# Patient Record
Sex: Female | Born: 1990 | Race: Black or African American | Hispanic: No | Marital: Single | Smoking: Never smoker
Health system: Southern US, Community
[De-identification: ages and names within clinical notes are randomized; demographics above are authoritative.]

## PROBLEM LIST (undated history)

## (undated) DIAGNOSIS — F419 Anxiety disorder, unspecified: Secondary | ICD-10-CM

## (undated) DIAGNOSIS — G35 Multiple sclerosis: Secondary | ICD-10-CM

## (undated) DIAGNOSIS — G35D Multiple sclerosis, unspecified: Secondary | ICD-10-CM

## (undated) DIAGNOSIS — F32A Depression, unspecified: Secondary | ICD-10-CM

## (undated) HISTORY — PX: WISDOM TOOTH EXTRACTION: SHX21

---

## 2018-11-17 ENCOUNTER — Encounter (HOSPITAL_COMMUNITY): Payer: Self-pay | Admitting: Emergency Medicine

## 2018-11-17 ENCOUNTER — Other Ambulatory Visit: Payer: Self-pay

## 2018-11-17 ENCOUNTER — Emergency Department (HOSPITAL_COMMUNITY)
Admission: EM | Admit: 2018-11-17 | Discharge: 2018-11-17 | Disposition: A | Discharge status: Home / Self Care | Payer: BLUE CROSS/BLUE SHIELD | Attending: Emergency Medicine | Admitting: Emergency Medicine

## 2018-11-17 DIAGNOSIS — R1030 Lower abdominal pain, unspecified: Secondary | ICD-10-CM | POA: Insufficient documentation

## 2018-11-17 DIAGNOSIS — Z3A01 Less than 8 weeks gestation of pregnancy: Secondary | ICD-10-CM | POA: Insufficient documentation

## 2018-11-17 DIAGNOSIS — O021 Missed abortion: Secondary | ICD-10-CM | POA: Diagnosis present

## 2018-11-17 LAB — CBC WITH DIFFERENTIAL/PLATELET
Abs Immature Granulocytes: 0.02 10*3/uL (ref 0.00–0.07)
Basophils Absolute: 0 10*3/uL (ref 0.0–0.1)
Basophils Relative: 0 %
Eosinophils Absolute: 0 10*3/uL (ref 0.0–0.5)
Eosinophils Relative: 1 %
HCT: 36.1 % (ref 36.0–46.0)
Hemoglobin: 11.5 g/dL — ABNORMAL LOW (ref 12.0–15.0)
Immature Granulocytes: 0 %
Lymphocytes Relative: 20 %
Lymphs Abs: 1.6 10*3/uL (ref 0.7–4.0)
MCH: 29 pg (ref 26.0–34.0)
MCHC: 31.9 g/dL (ref 30.0–36.0)
MCV: 91.2 fL (ref 80.0–100.0)
Monocytes Absolute: 0.5 10*3/uL (ref 0.1–1.0)
Monocytes Relative: 6 %
Neutro Abs: 5.8 10*3/uL (ref 1.7–7.7)
Neutrophils Relative %: 73 %
Platelets: 231 10*3/uL (ref 150–400)
RBC: 3.96 MIL/uL (ref 3.87–5.11)
RDW: 13.2 % (ref 11.5–15.5)
WBC: 8.1 10*3/uL (ref 4.0–10.5)
nRBC: 0 % (ref 0.0–0.2)

## 2018-11-17 LAB — COMPREHENSIVE METABOLIC PANEL
ALT: 16 U/L (ref 0–44)
AST: 22 U/L (ref 15–41)
Albumin: 4.1 g/dL (ref 3.5–5.0)
Alkaline Phosphatase: 39 U/L (ref 38–126)
Anion gap: 11 (ref 5–15)
BUN: 11 mg/dL (ref 6–20)
CO2: 19 mmol/L — ABNORMAL LOW (ref 22–32)
Calcium: 8.9 mg/dL (ref 8.9–10.3)
Chloride: 107 mmol/L (ref 98–111)
Creatinine, Ser: 0.91 mg/dL (ref 0.44–1.00)
GFR calc Af Amer: >60 mL/min (ref >60)
GFR calc non Af Amer: >60 mL/min (ref >60)
Glucose, Bld: 114 mg/dL — ABNORMAL HIGH (ref 70–99)
Potassium: 3.4 mmol/L — ABNORMAL LOW (ref 3.5–5.1)
Sodium: 137 mmol/L (ref 135–145)
Total Bilirubin: <0.1 mg/dL — ABNORMAL LOW (ref 0.3–1.2)
Total Protein: 7.4 g/dL (ref 6.5–8.1)

## 2018-11-17 LAB — I-STAT BETA HCG BLOOD, ED (MC, WL, AP ONLY): I-stat hCG, quantitative: >2000 m[IU]/mL — ABNORMAL HIGH (ref <5)

## 2018-11-17 LAB — LIPASE, BLOOD: Lipase: 25 U/L (ref 11–51)

## 2018-11-17 MED ORDER — FENTANYL CITRATE (PF) 100 MCG/2ML IJ SOLN
50.0000 ug | Freq: Once | INTRAMUSCULAR | Status: AC
  Administered 2018-11-17: 50 ug via INTRAVENOUS
  Filled 2018-11-17: qty 2

## 2018-11-17 MED ORDER — MISOPROSTOL 200 MCG PO TABS
800.0000 ug | ORAL_TABLET | Freq: Once | ORAL | Status: AC
  Administered 2018-11-17: 08:00:00 800 ug via VAGINAL
  Filled 2018-11-17 (×2): qty 4

## 2018-11-17 MED ORDER — ONDANSETRON HCL 4 MG/2ML IJ SOLN
4.0000 mg | Freq: Once | INTRAMUSCULAR | Status: AC
  Administered 2018-11-17: 4 mg via INTRAVENOUS
  Filled 2018-11-17: qty 2

## 2018-11-17 MED ORDER — FENTANYL CITRATE (PF) 100 MCG/2ML IJ SOLN
100.0000 ug | Freq: Once | INTRAMUSCULAR | Status: AC
  Administered 2018-11-17: 100 ug via INTRAVENOUS
  Filled 2018-11-17: qty 2

## 2018-11-17 MED ORDER — IBUPROFEN 600 MG PO TABS: 600.0000 mg | ORAL_TABLET | Freq: Four times a day (QID) | ORAL | 0 refills | Status: AC | PRN

## 2018-11-17 MED ORDER — MORPHINE SULFATE (PF) 4 MG/ML IV SOLN: 4.0000 mg | Freq: Once | INTRAVENOUS | Status: DC

## 2018-11-17 MED ORDER — KETOROLAC TROMETHAMINE 30 MG/ML IJ SOLN
30.0000 mg | Freq: Once | INTRAMUSCULAR | Status: AC
  Administered 2018-11-17: 06:00:00 30 mg via INTRAVENOUS
  Filled 2018-11-17: qty 1

## 2018-11-17 MED ORDER — HYDROCODONE-ACETAMINOPHEN 5-325 MG PO TABS: 1.0000 | ORAL_TABLET | ORAL | 0 refills | Status: AC | PRN

## 2018-11-17 NOTE — ED Triage Notes (Signed)
Patient is complaining of abdominal pain all over. Patient states she is vomiting blood. In the emesis bag it is a light greenish/yellow color. Patient states she took the plan B at 1pm yesterday. Patient states that she is not bleeding by cramping all over.

## 2018-11-17 NOTE — ED Provider Notes (Addendum)
  Physical Exam  BP 130/72   Pulse 98   Temp 98.3 F (36.8 C) (Oral)   Resp 16   SpO2 98%   Physical Exam  ED Course/Procedures     Procedures  MDM  Patient care received from [facility] at shift change, please see his note for a full HPI.  Briefly, patient was [redacted] weeks pregnant, had a missed abortion OB/GYN Dr. Posey Pronto at [facility] prescribed misoprostol to help with passing.  Patient arrived in the ED with cramping but no bleeding.  Reports pain is quite severe.  Colleague consulted OB/GYN Dr. Marciano Sequin who recommended 800 mcg of Cytotec vaginally and 100 mcg of fentanyl observing for approximately 2 hours if bleeding occurred.  10:14 AM patient reassessed by me, reports she continues to have abdominal cramping.  Inserted Cytotec intravaginally about 3 hours ago has had no bleeding.  At this time will consult OB/GYN on-call for further recommendations.  10:32 AM Spoke to Dr. Roland Rack who stated no need for emergent D/C, patient should follow-up with her OB/GYN.  Will prescribe her 600 mg ibuprofen to treat her pain at home.  Prior to discharge will consult Dr. Posey Pronto.  11:07 AM Spoke to Dr. Posey Pronto, patient's primary OB/GYN he recommended patient follow-up in office on Tuesday.  She will go home with a prescription for ibuprofen 600 mg.  These results were discussed with patient, she will follow-up with Dr. Posey Pronto in office.  Patient began to pass clots prior to discharge.  Encouraged follow-up with her OB/GYN. She was also given a short prescription of NORCO to help with symptoms.  Portions of this note were generated with [employer]. Dictation errors may occur despite best attempts at proofreading.      Janeece Fitting, PA-C 11/17/18 1147    Janeece Fitting, PA-C 11/17/18 1148    Tegeler, Gwenyth Allegra, MD 11/17/18 [phone]

## 2018-11-17 NOTE — Discharge Instructions (Addendum)
Please follow up with Dr. Posey Pronto in office on Tuesday, call to schedule an appointment for Tuesday follow up. If you experience any fever, worsening pain please return to the ED.

## 2018-11-17 NOTE — ED Notes (Signed)
Discharge paperwork reviewed with pt, including follow up care and prescriptions.  Pt requesting wheelchair to ED entrance to meet ride.

## 2018-11-17 NOTE — ED Notes (Signed)
Pt stated she had an "accident" and requested for something to clean herself with. Pt provided with wash cloths, towels and a fresh gown.

## 2018-11-17 NOTE — ED Notes (Signed)
RN called to room, pt reporting that she has now started to vaginally pass clots.  PA, Johana, made aware.   Pt provided clean underpants and sanitary pad.

## 2018-11-17 NOTE — ED Provider Notes (Signed)
Patient seen/examined in the Emergency Department in conjunction with [facility] Provider [facility] Patient reports abdominal cramping after receiving oral Cytotec from her GYN for incomplete abortion Exam : Awake alert, appears uncomfortable, lower abdominal tenderness. Plan: Gynecology recommends giving Cytotec intravaginally, and monitoring patient.  If no bleeding, she would need [facility]    Ripley Fraise, MD 11/17/18 [phone]

## 2018-11-17 NOTE — ED Notes (Signed)
Pt rang out, said she had to vomit. Immediately took her an emesis bag.

## 2018-11-17 NOTE — ED Notes (Signed)
Bed: WTR5 Expected date:  Expected time:  Means of arrival:  Comments: 

## 2018-11-17 NOTE — ED Provider Notes (Signed)
[facility]-EMERGENCY DEPT Provider Note   CSN: [id number] Arrival date & time: 11/17/18  0433     History   Chief Complaint Chief Complaint  Patient presents with  . Abdominal Pain    HPI Carol Berger is a 28 y.o. female.     Patient presents to the emergency department with a chief complaint of abdominal cramps.  She states that yesterday she saw a doctor and was prescribed misoprostil for missed abortion.  She reports having severe abdominal cramps with associated nausea and vomiting.  She states that she has had some streaky blood in her vomit.  She denies any vaginal bleeding.  She has tried taking Tylenol with no relief.  She denies any other associated symptoms.  The history is provided by the patient. No language interpreter was used.    History reviewed. No pertinent past medical history.  There are no active problems to display for this patient.   History reviewed. No pertinent surgical history.   OB History    Gravida  1   Para      Term      Preterm      AB      Living        SAB      TAB      Ectopic      Multiple      Live Births               Home Medications    Prior to Admission medications   Medication Sig Start Date End Date Taking? Authorizing Provider  acetaminophen (TYLENOL) 500 MG tablet Take 1,000 mg by mouth every 6 (six) hours as needed for moderate pain.   Yes [provider]  misoprostol (CYTOTEC) 200 MCG tablet Take 200 mcg by mouth every 8 (eight) hours.  11/14/18  Yes [provider]    Family History History reviewed. No pertinent family history.  Social History Social History   Tobacco Use  . Smoking status: Never Smoker  . Smokeless tobacco: Never Used  Substance Use Topics  . Alcohol use: Not Currently  . Drug use: Not Currently     Allergies   Patient has no known allergies.   Review of Systems Review of Systems  All other systems reviewed and are  negative.    Physical Exam Updated Vital Signs BP 128/77   Pulse (!) 104   Temp 98.3 F (36.8 C) (Oral)   Resp 16   SpO2 94%   Physical Exam Vitals signs and nursing note reviewed.  Constitutional:      General: She is not in acute distress.    Appearance: She is well-developed.  HENT:     Head: Normocephalic and atraumatic.  Eyes:     Conjunctiva/sclera: Conjunctivae normal.  Neck:     Musculoskeletal: Neck supple.  Cardiovascular:     Rate and Rhythm: Normal rate and regular rhythm.     Heart sounds: No murmur.  Pulmonary:     Effort: Pulmonary effort is normal. No respiratory distress.     Breath sounds: Normal breath sounds.  Abdominal:     Palpations: Abdomen is soft.     Tenderness: There is no abdominal tenderness.  Skin:    General: Skin is warm and dry.  Neurological:     Mental Status: She is alert and oriented to person, place, and time.  Psychiatric:        Mood and Affect: Mood normal.  Behavior: Behavior normal.      ED Treatments / Results  Labs (all labs ordered are listed, but only abnormal results are displayed) Labs Reviewed  CBC WITH DIFFERENTIAL/PLATELET  COMPREHENSIVE METABOLIC PANEL  LIPASE, BLOOD  I-STAT BETA HCG BLOOD, ED ([facility], [facility], [facility] ONLY)    EKG None  Radiology No results found.  Procedures Procedures (including critical care time)  Medications Ordered in ED Medications  ketorolac (TORADOL) 30 MG/ML injection 30 mg (has no administration in time range)  ondansetron (ZOFRAN) injection 4 mg (has no administration in time range)     Initial Impression / Assessment and Plan / ED Course  I have reviewed the triage vital signs and the nursing notes.  Pertinent labs & imaging results that were available during my care of the patient were reviewed by me and considered in my medical decision making (see chart for details).        Patient with missed abortion.  Started on Cytotec.  Having cramping, but no bleeding.   Pain is quite severe.  Discussed with Dr. Elonda Husky from OB/GYN, who recommends 800 mcg Cytotec vaginally and 100 mcg fentanyl.  Recommends observing patient for approximately 2 hours to see if she starts to bleed pass tissue.  If not, will need to reconsult OB/GYN for possible D&C.  Patient signed out to [place], [place].  Final Clinical Impressions(s) / ED Diagnoses   Final diagnoses:  None    ED Discharge Orders    None       Montine Circle, PA-C 11/17/18 [id number]    Ripley Fraise, MD 11/17/18 [phone]

## 2019-03-13 ENCOUNTER — Other Ambulatory Visit: Payer: Self-pay

## 2019-03-13 ENCOUNTER — Encounter (HOSPITAL_COMMUNITY): Payer: Self-pay | Admitting: Student

## 2019-03-13 ENCOUNTER — Inpatient Hospital Stay (HOSPITAL_COMMUNITY)
Admission: AD | Admit: 2019-03-13 | Discharge: 2019-03-13 | Disposition: A | Discharge status: Home / Self Care | Payer: BLUE CROSS/BLUE SHIELD | Attending: Obstetrics and Gynecology | Admitting: Obstetrics and Gynecology

## 2019-03-13 DIAGNOSIS — R110 Nausea: Secondary | ICD-10-CM | POA: Diagnosis not present

## 2019-03-13 DIAGNOSIS — R109 Unspecified abdominal pain: Secondary | ICD-10-CM | POA: Diagnosis not present

## 2019-03-13 DIAGNOSIS — N939 Abnormal uterine and vaginal bleeding, unspecified: Secondary | ICD-10-CM | POA: Diagnosis not present

## 2019-03-13 DIAGNOSIS — Z3202 Encounter for pregnancy test, result negative: Secondary | ICD-10-CM

## 2019-03-13 LAB — POCT PREGNANCY, URINE: Preg Test, Ur: NEGATIVE

## 2019-03-13 NOTE — MAU Provider Note (Signed)
First Provider Initiated Contact with Patient 03/13/19 1121      S Ms. Carol Berger is a 28 y.o. G1P0010 non-pregnant female who presents to MAU today with complaint of vaginal bleeding, abdominal cramping, & nausea. Had a miscarriage in June then started on birth control for a month & stopped it. Has had irregular bleeding since then. Goes to ob/gyn in [place]. Had an appointment with them last month but didn't go because the bleeding had stopped. Now has an appointment with them tomorrow for evaluation. Has not taken a pregnancy test at home. Is not saturating pads.    O BP 126/78   Pulse 81   Temp 98.3 F (36.8 C)   Resp 16   Ht 5\' 6"  (1.676 m)   Wt 105.2 kg   LMP 02/15/2019   SpO2 100%   BMI 37.45 kg/m  Physical Exam  Nursing note and vitals reviewed. Constitutional: She appears well-developed and well-nourished. No distress.  HENT:  Head: Normocephalic and atraumatic.  Respiratory: Effort normal. No respiratory distress.  Skin: She is not diaphoretic.  Psychiatric: She has a normal mood and affect. Her behavior is normal. Judgment and thought content normal.    A Non pregnant female Medical screening exam complete 1. Negative pregnancy test     P Discharge from MAU in stable condition Patient has f/u with her gyn tomorrow - encouraged to keep that appointment Instructed to f/u with the ED if her bleeding becomes heavy or abdominal pain becomes severe  Jorje Guild, NP 03/13/2019 11:21 AM

## 2019-03-13 NOTE — MAU Note (Signed)
.   Carol Berger is a 28 y.o. at Unknown here in MAU reporting: that her last normal cycle was the end of September, miscarriage in June and was taking birth control until last month. PT states that she has been having vaginal bleeding on and off since the miscarriage. Lower abdominal pain and vomiting LMP: 02/15/19 Onset of complaint: ongoing Pain score: 9 Vitals:   03/13/19 1113  BP: 126/78  Pulse: 81  Resp: 16  Temp: 98.3 F (36.8 C)  SpO2: 100%     FHT Lab orders placed from triage: UPT

## 2019-08-02 ENCOUNTER — Other Ambulatory Visit: Payer: Self-pay

## 2019-08-02 ENCOUNTER — Emergency Department (HOSPITAL_COMMUNITY): Payer: Managed Care, Other (non HMO)

## 2019-08-02 ENCOUNTER — Inpatient Hospital Stay (HOSPITAL_COMMUNITY)
Admission: EM | Admit: 2019-08-02 | Discharge: 2019-08-03 | DRG: 060 | Disposition: A | Discharge status: Home / Self Care | Payer: Managed Care, Other (non HMO) | Source: Ambulatory Visit | Attending: Internal Medicine | Admitting: Internal Medicine

## 2019-08-02 ENCOUNTER — Encounter (HOSPITAL_COMMUNITY): Payer: Self-pay | Admitting: Emergency Medicine

## 2019-08-02 DIAGNOSIS — Z20822 Contact with and (suspected) exposure to covid-19: Secondary | ICD-10-CM | POA: Diagnosis present

## 2019-08-02 DIAGNOSIS — Z6835 Body mass index (BMI) 35.0-35.9, adult: Secondary | ICD-10-CM | POA: Diagnosis not present

## 2019-08-02 DIAGNOSIS — G35 Multiple sclerosis: Secondary | ICD-10-CM | POA: Diagnosis present

## 2019-08-02 DIAGNOSIS — D709 Neutropenia, unspecified: Secondary | ICD-10-CM | POA: Diagnosis present

## 2019-08-02 DIAGNOSIS — G360 Neuromyelitis optica [Devic]: Principal | ICD-10-CM | POA: Diagnosis present

## 2019-08-02 DIAGNOSIS — E669 Obesity, unspecified: Secondary | ICD-10-CM | POA: Diagnosis present

## 2019-08-02 DIAGNOSIS — Z79899 Other long term (current) drug therapy: Secondary | ICD-10-CM | POA: Diagnosis not present

## 2019-08-02 DIAGNOSIS — H469 Unspecified optic neuritis: Secondary | ICD-10-CM | POA: Diagnosis present

## 2019-08-02 DIAGNOSIS — E66812 Obesity, class 2: Secondary | ICD-10-CM | POA: Diagnosis present

## 2019-08-02 LAB — BASIC METABOLIC PANEL
Anion gap: 9 (ref 5–15)
BUN: 10 mg/dL (ref 6–20)
CO2: 22 mmol/L (ref 22–32)
Calcium: 9 mg/dL (ref 8.9–10.3)
Chloride: 105 mmol/L (ref 98–111)
Creatinine, Ser: 0.82 mg/dL (ref 0.44–1.00)
GFR calc Af Amer: >60 mL/min (ref >60)
GFR calc non Af Amer: >60 mL/min (ref >60)
Glucose, Bld: 90 mg/dL (ref 70–99)
Potassium: 4 mmol/L (ref 3.5–5.1)
Sodium: 136 mmol/L (ref 135–145)

## 2019-08-02 LAB — CBC WITH DIFFERENTIAL/PLATELET
Abs Immature Granulocytes: 0.01 10*3/uL (ref 0.00–0.07)
Basophils Absolute: 0 10*3/uL (ref 0.0–0.1)
Basophils Relative: 1 %
Eosinophils Absolute: 0.1 10*3/uL (ref 0.0–0.5)
Eosinophils Relative: 2 %
HCT: 37.8 % (ref 36.0–46.0)
Hemoglobin: 12 g/dL (ref 12.0–15.0)
Immature Granulocytes: 0 %
Lymphocytes Relative: 50 %
Lymphs Abs: 2.2 10*3/uL (ref 0.7–4.0)
MCH: 29.2 pg (ref 26.0–34.0)
MCHC: 31.7 g/dL (ref 30.0–36.0)
MCV: 92 fL (ref 80.0–100.0)
Monocytes Absolute: 0.4 10*3/uL (ref 0.1–1.0)
Monocytes Relative: 10 %
Neutro Abs: 1.6 10*3/uL — ABNORMAL LOW (ref 1.7–7.7)
Neutrophils Relative %: 37 %
Platelets: 209 10*3/uL (ref 150–400)
RBC: 4.11 MIL/uL (ref 3.87–5.11)
RDW: 14.1 % (ref 11.5–15.5)
WBC: 4.3 10*3/uL (ref 4.0–10.5)
nRBC: 0 % (ref 0.0–0.2)

## 2019-08-02 LAB — CBC
HCT: 36.9 % (ref 36.0–46.0)
Hemoglobin: 11.8 g/dL — ABNORMAL LOW (ref 12.0–15.0)
MCH: 29 pg (ref 26.0–34.0)
MCHC: 32 g/dL (ref 30.0–36.0)
MCV: 90.7 fL (ref 80.0–100.0)
Platelets: 225 10*3/uL (ref 150–400)
RBC: 4.07 MIL/uL (ref 3.87–5.11)
RDW: 14.5 % (ref 11.5–15.5)
WBC: 4.4 10*3/uL (ref 4.0–10.5)
nRBC: 0 % (ref 0.0–0.2)

## 2019-08-02 LAB — I-STAT BETA HCG BLOOD, ED (MC, WL, AP ONLY): I-stat hCG, quantitative: <5 m[IU]/mL (ref <5)

## 2019-08-02 LAB — SEDIMENTATION RATE: Sed Rate: 1 mm/hr (ref 0–22)

## 2019-08-02 LAB — SARS CORONAVIRUS 2 (TAT 6-24 HRS): SARS Coronavirus 2: NEGATIVE

## 2019-08-02 LAB — C-REACTIVE PROTEIN: CRP: 0.5 mg/dL (ref <1.0)

## 2019-08-02 IMAGING — MR MR ORBITS WO/W CM
9 of 16 series · 22 of 48 positions shown · IV contrast (gadavist)
Comparison: None.

CLINICAL DATA: Optic neuritis suspected on the left. Two weeks
duration.

EXAM:
MRI HEAD AND ORBITS WITHOUT AND WITH CONTRAST
TECHNIQUE: Multiplanar, multiecho pulse sequences of the brain and surrounding
structures were obtained without and with intravenous contrast.
Multiplanar, multiecho pulse sequences of the orbits and surrounding
structures were obtained including fat saturation techniques, before
and after intravenous contrast administration.
CONTRAST:  9mL GADAVIST GADOBUTROL 1 MMOL/ML IV SOLN

[Series 4: T2 · axial · 5.0mm · 0.43mm/px · z∈[-80,+66]mm · 2 of 22 slices shown]
[im 1/22]
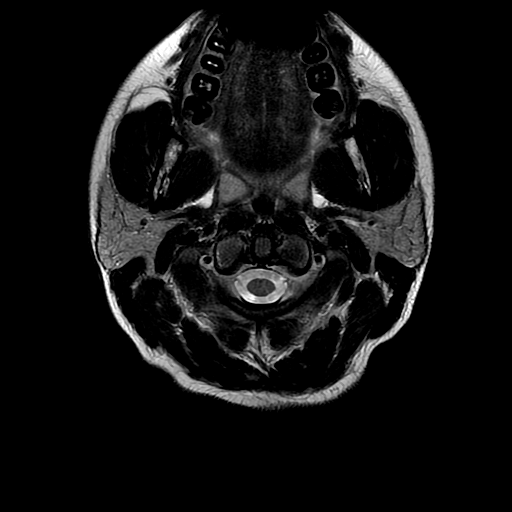
[im 22/22]
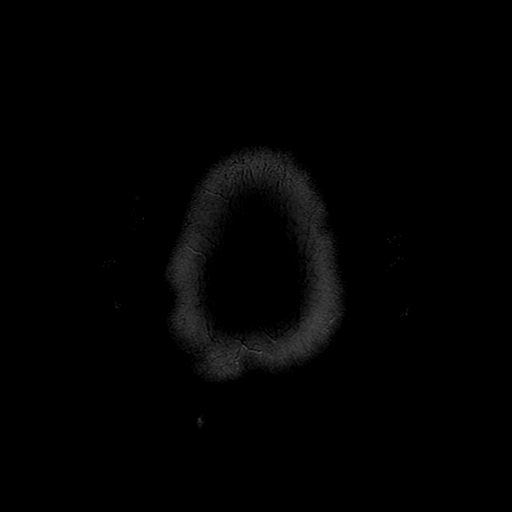

[Series 5: FLAIR · axial · 5.0mm · 0.43mm/px · z∈[-80,+66]mm · 2 of 22 slices shown (1 of 2)]
[im 1/22]
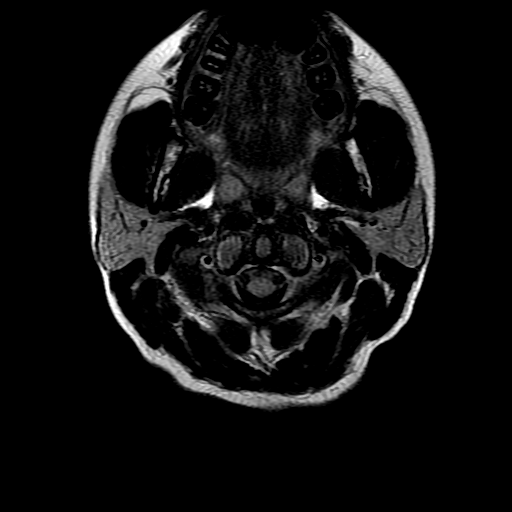
[im 22/22]
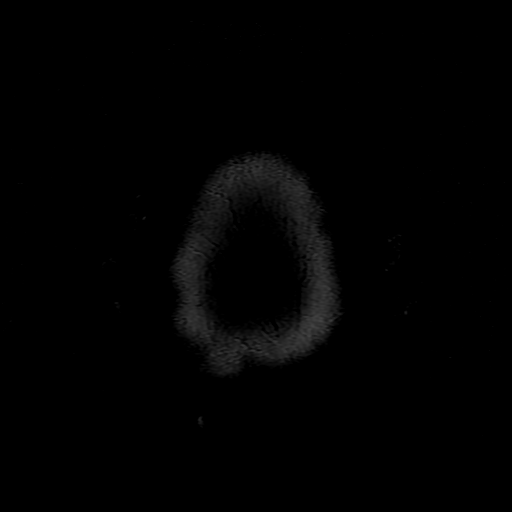

[Series 6: DWI · axial · 5.0mm · 1.09mm/px · z∈[-81,+63]mm · 5 of 60 slices shown]
[im 1/60]
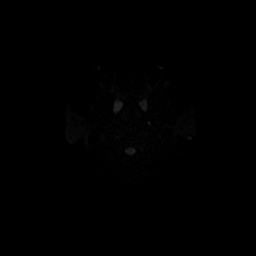
[im 15/60]
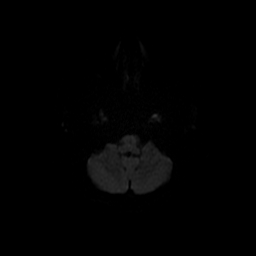
[im 30/60]
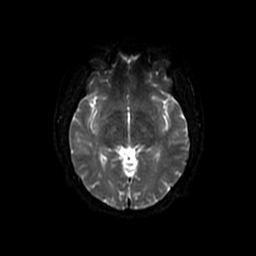
[im 45/60]
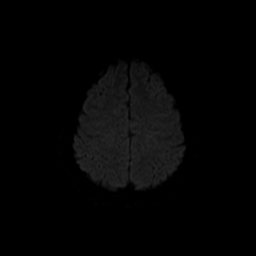
[im 60/60]
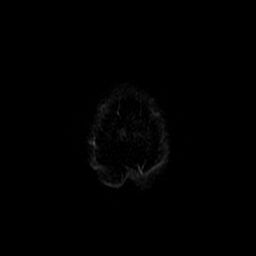

[Series 11: T2 fat-sat · axial · 3.0mm · 0.35mm/px · z∈[-55,+8]mm · 2 of 20 slices shown (1 of 2)]
[im 1/20]
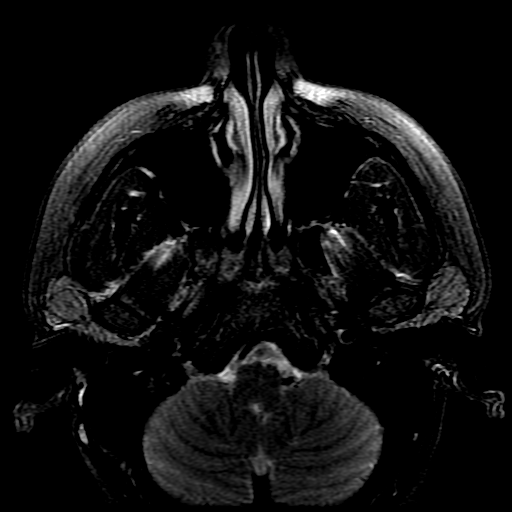
[im 20/20]
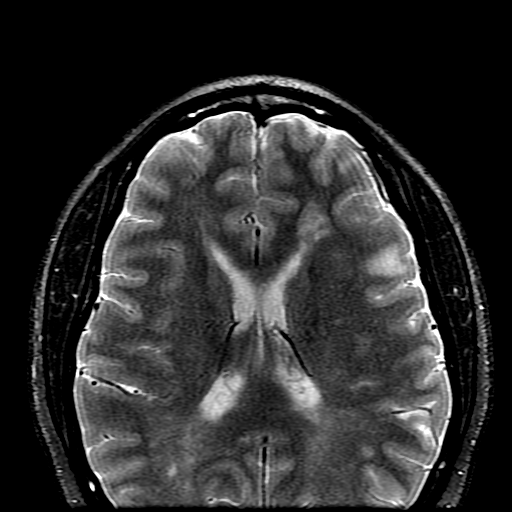

[Series 12: T2 fat-sat · coronal · 3.0mm · 0.35mm/px · 1 of 34 slices shown (2 of 2)]
[im 1/34]
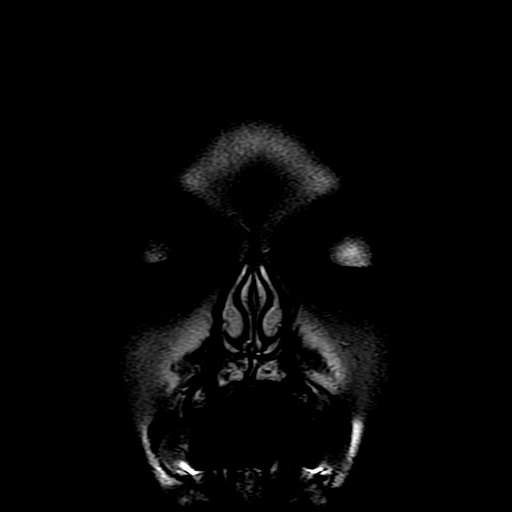

[Series 15: T1 post-contrast · coronal · 5.0mm · 0.39mm/px · 2 of 28 slices shown (1 of 3)]
[im 1/28]
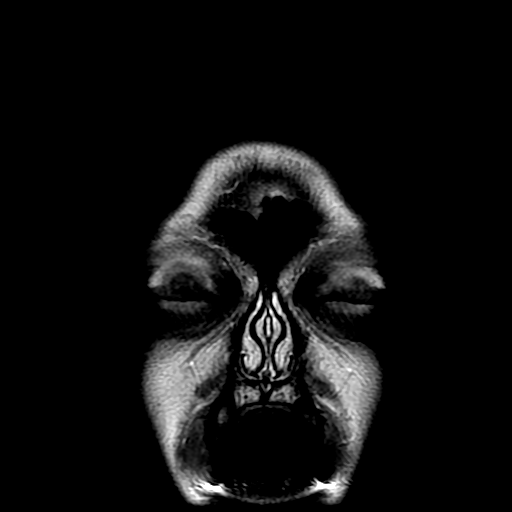
[im 28/28]
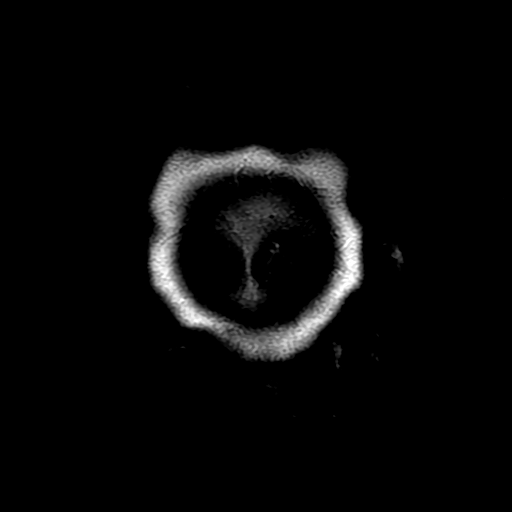

[Series 16: T1 post-contrast · axial · 3.0mm · 0.35mm/px · z∈[-55,+8]mm · 2 of 20 slices shown (2 of 3)]
[im 1/20]
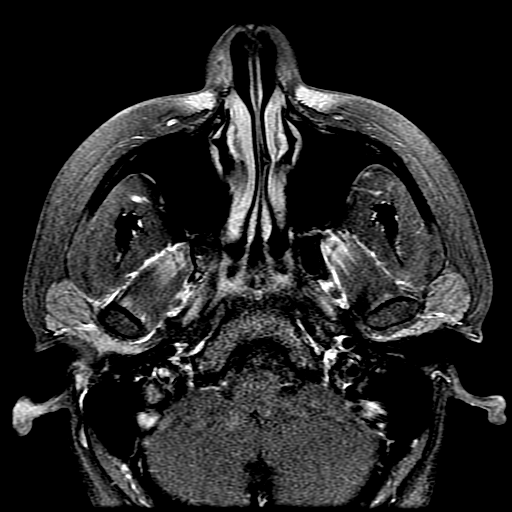
[im 20/20]
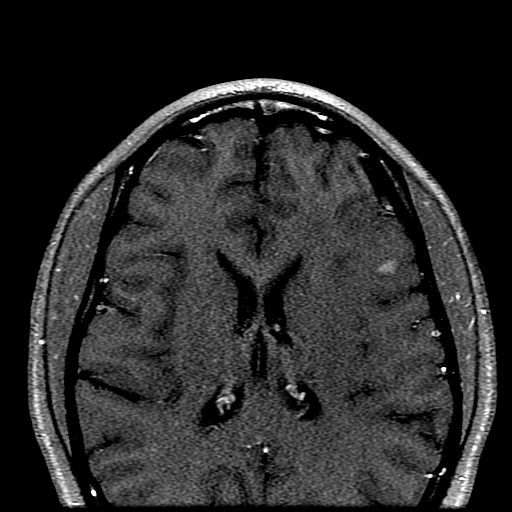

[Series 17: T1 post-contrast · coronal · 3.0mm · 0.35mm/px · 3 of 34 slices shown (3 of 3)]
[im 1/34]
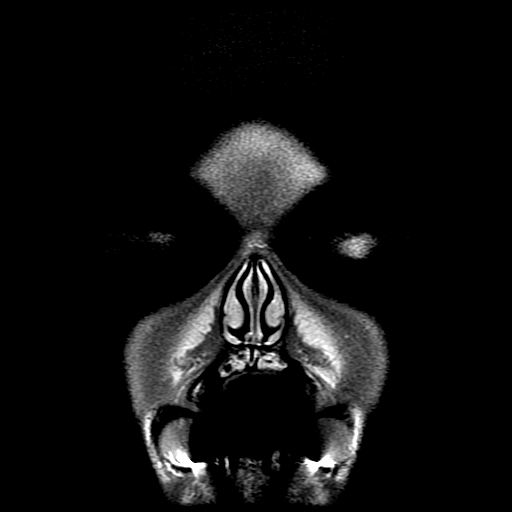
[im 17/34]
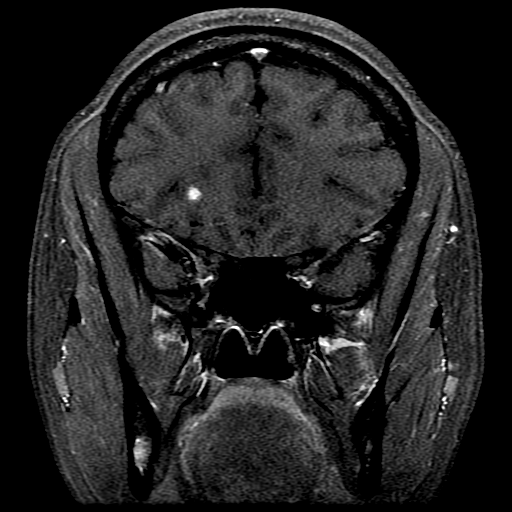
[im 34/34]
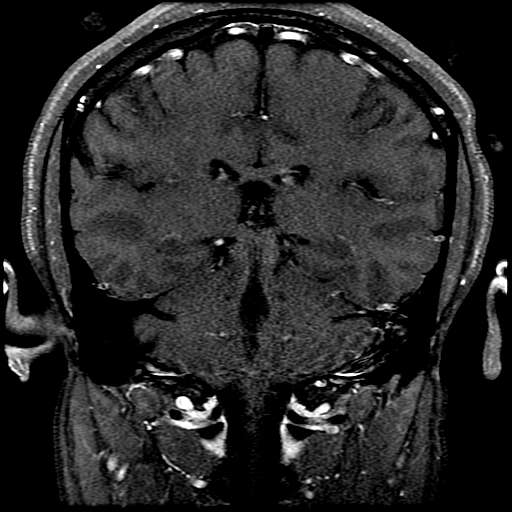

[Series 18: FLAIR · sagittal · 4.0mm · 0.47mm/px · 3 of 30 slices shown (2 of 2)]
[im 1/30]
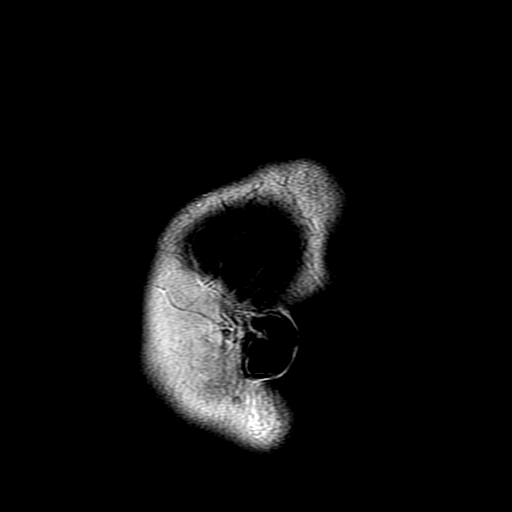
[im 15/30]
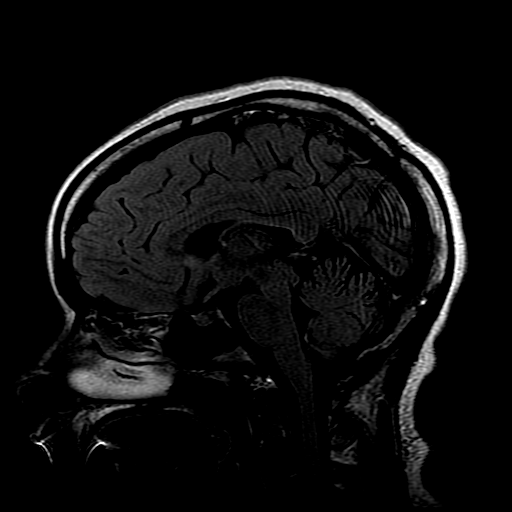
[im 30/30]
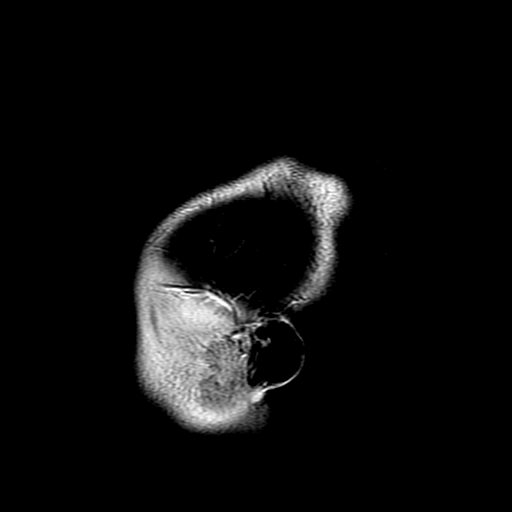

[22 of 48 positions shown; findings below may reference images not displayed]

FINDINGS: MRI HEAD FINDINGS

Brain: No abnormality seen affecting the brainstem or cerebellum.
Cerebral hemispheres show multiple foci of T2 and FLAIR signal
affecting the deep and subcortical white matter with a pattern
typical of multiple sclerosis. There are approximately 15 lesions
within each hemisphere. 5 small white matter lesions in the right
frontal lobe show contrast enhancement. Left frontal subcortical
white matter lesion shows contrast enhancement. No evidence of mass
lesion, ischemic infarction, hemorrhage, hydrocephalus or
extra-axial collection.

Vascular: Major vessels at the base of the brain show flow.

Skull and upper cervical spine: Negative

Other: None

MRI ORBITS FINDINGS

Orbits: Globes are normal. Optic neuritis on the left involving a
segment of the nerve approximately 1.2 cm in length with increased
T2 signal and contrast enhancement, also consistent with multiple
sclerosis involvement. Right optic nerve appears normal.

Visualized sinuses: Paranasal sinuses are clear

Soft tissues: No soft tissue lesion in the area.

Limited intracranial: See results of brain MRI.
IMPRESSION: Acute demyelinating disease/multiple sclerosis presentation.
Multiple T2 and FLAIR bright lesions scattered throughout the
cerebral hemispheric white matter with a pattern and distribution
typical of multiple sclerosis. Approximately 15 lesions in each
hemisphere. Scattered lesions show contrast enhancement, further
evidence of active nature.

Acute optic neuritis on the left affecting a 12 mm segment of the
nerve, pre chiasmatic.

## 2019-08-02 IMAGING — DX DG CHEST 1V PORT
1 series · 1 of 1 positions shown · non-contrast
Comparison: None.

CLINICAL DATA: Blurry vision MS

EXAM:
PORTABLE CHEST 1 VIEW

[chest ap]
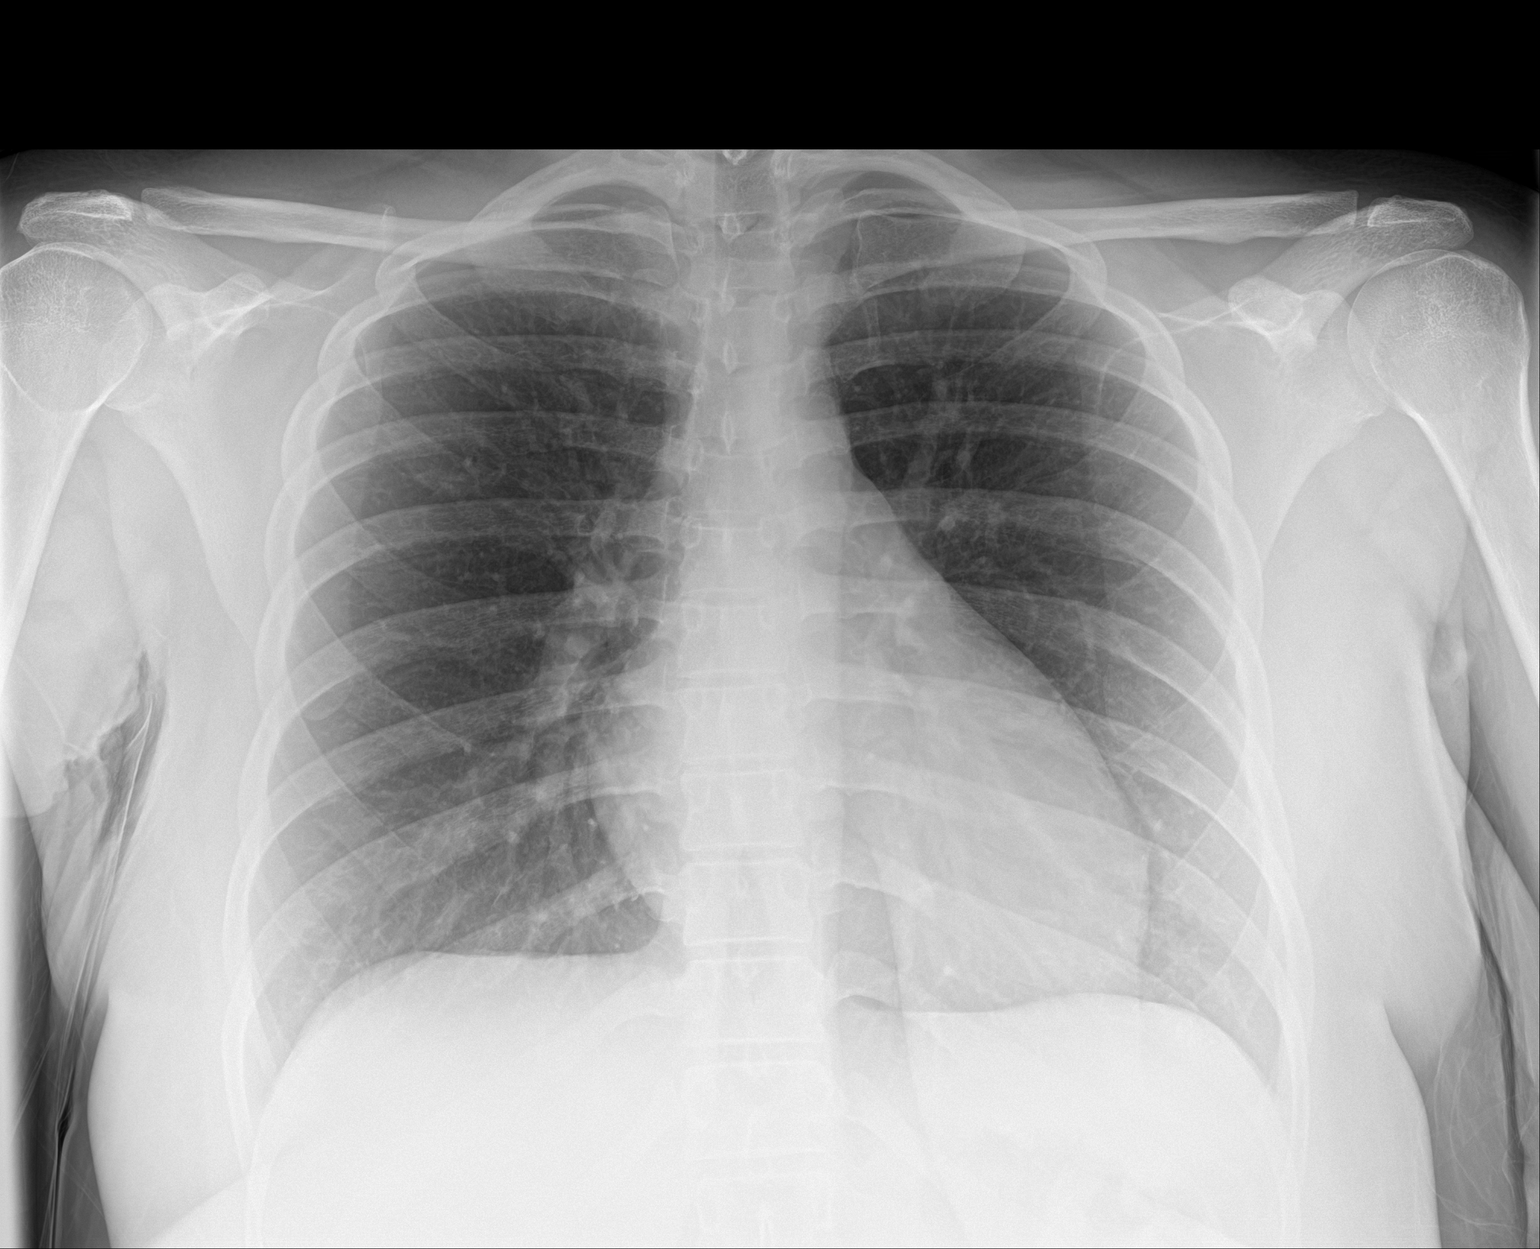

[1 of 1 positions shown; findings below may reference images not displayed]

FINDINGS: The heart size and mediastinal contours are within normal limits.
Both lungs are clear. The visualized skeletal structures are
unremarkable.
IMPRESSION: No active disease.

## 2019-08-02 MED ORDER — SODIUM CHLORIDE 0.9 % IV SOLN
1000.0000 mg | Freq: Once | INTRAVENOUS | Status: AC
  Administered 2019-08-02: 1000 mg via INTRAVENOUS
  Filled 2019-08-02: qty 8

## 2019-08-02 MED ORDER — HYDROCODONE-ACETAMINOPHEN 5-325 MG PO TABS: 1.0000 | ORAL_TABLET | ORAL | Status: DC | PRN

## 2019-08-02 MED ORDER — ONDANSETRON HCL 4 MG PO TABS: 4.0000 mg | ORAL_TABLET | Freq: Four times a day (QID) | ORAL | Status: DC | PRN

## 2019-08-02 MED ORDER — ENOXAPARIN SODIUM 40 MG/0.4ML ~~LOC~~ SOLN
40.0000 mg | SUBCUTANEOUS | Status: DC
  Administered 2019-08-02: 40 mg via SUBCUTANEOUS
  Filled 2019-08-02: qty 0.4

## 2019-08-02 MED ORDER — ACETAMINOPHEN 325 MG PO TABS
650.0000 mg | ORAL_TABLET | Freq: Four times a day (QID) | ORAL | Status: DC | PRN
  Filled 2019-08-02: qty 2

## 2019-08-02 MED ORDER — SODIUM CHLORIDE 0.9 % IV SOLN: 1000.0000 mg | Freq: Every day | INTRAVENOUS | Status: DC

## 2019-08-02 MED ORDER — SODIUM CHLORIDE 0.9 % IV SOLN
1000.0000 mg | Freq: Every day | INTRAVENOUS | Status: DC
  Administered 2019-08-03: 1000 mg via INTRAVENOUS
  Filled 2019-08-02: qty 8

## 2019-08-02 MED ORDER — SENNOSIDES-DOCUSATE SODIUM 8.6-50 MG PO TABS: 1.0000 | ORAL_TABLET | Freq: Every evening | ORAL | Status: DC | PRN

## 2019-08-02 MED ORDER — GADOBUTROL 1 MMOL/ML IV SOLN
9.0000 mL | Freq: Once | INTRAVENOUS | Status: AC | PRN
  Administered 2019-08-02: 9 mL via INTRAVENOUS

## 2019-08-02 MED ORDER — ZOLPIDEM TARTRATE 5 MG PO TABS: 5.0000 mg | ORAL_TABLET | Freq: Every evening | ORAL | Status: DC | PRN

## 2019-08-02 MED ORDER — ONDANSETRON HCL 4 MG/2ML IJ SOLN: 4.0000 mg | Freq: Four times a day (QID) | INTRAMUSCULAR | Status: DC | PRN

## 2019-08-02 MED ORDER — ACETAMINOPHEN 650 MG RE SUPP: 650.0000 mg | Freq: Four times a day (QID) | RECTAL | Status: DC | PRN

## 2019-08-02 NOTE — ED Notes (Signed)
Rec'd pt from ED nurse, solumedrol infusion had not been started. Infusion in tube station and started.

## 2019-08-02 NOTE — H&P (Signed)
History and Physical    Yarissa Reining QIW:[id number] DOB: 12/29/1990 DOA: 08/02/2019  PCP: Patient, No Pcp Per   Patient coming from:   Chief Complaint: Ophthalmology office  HPI: Jaeda Bruso is a 29 y.o. female with no past medical history except for morbid obesity BMI 35 sent to the ER with left eye pain and vision loss.  She reports symptoms started 2 weeks ago on left eye with pain during movement, left-sided headache and blurry vision under peripheral vision.  She was seen by ophthalmologist Dr. Sherryll Burger who recommended/sent her to the ED for evaluation for MS/optic neuritis.  Patient has been complaining of intermittent tingling in her fingers(thumb and index finger bilaterally) for years and there is currently tingling in her toes bilaterally for last few months. Patient c/o blurry vision on left eye, able to see. She denies any fever nausea vomiting chest pain shortness of breath or focal weakness. Patient unable to confirm if any family history of multiple sclerosis.  ED Course: Patient was nonfocal on exam, vitals are stable, routine CBC BMP pregnancy test are unremarkable.  COVID-19 pending.  MRI report as follows significant for acute optic neuritis on the left, acute demyelinating disease/multiple sclerosis on cerebral hemisphere  "Acute demyelinating disease/multiple sclerosis presentation. Multiple T2 and FLAIR bright lesions scattered throughout the cerebral hemispheric white matter with a pattern and distribution typical of multiple sclerosis. Approximately 15 lesions in each hemisphere. Scattered lesions show contrast enhancement, further evidence of active nature. Acute optic neuritis on the left affecting a 12 mm segment of the nerve, pre chiasmatic"   Neurology was consulted and started on Solu-Medrol 1000 mg and admission was requested under hospitalist service.  Review of Systems: All systems were reviewed and were negative except as mentioned in HPI above. Negative for  fever Negative for chest pain Negative for shortness of breath  PMH No significant past medical history Past Surgical History:  Procedure Laterality Date  . WISDOM TOOTH EXTRACTION       reports that she has never smoked. She has never used smokeless tobacco. She reports previous alcohol use. She reports previous drug use.  No Known Allergies  No family history on file.   Prior to Admission medications   Medication Sig Start Date End Date Taking? Authorizing Provider  acetaminophen (TYLENOL) 500 MG tablet Take 500-1,000 mg by mouth every 6 (six) hours as needed for mild pain, moderate pain or headache.    Yes [provider]  norgestrel-ethinyl estradiol (CRYSELLE-28) 0.3-30 MG-MCG tablet Take 1 tablet by mouth daily.   Yes [provider]    Physical Exam: Vitals:   08/02/19 1137  BP: 133/73  Pulse: 66  Resp: 15  Temp: 98.1 F (36.7 C)  TempSrc: Oral  SpO2: 98%  Weight: 90.7 kg  Height: 5\' 3"  (1.6 m)   General exam: AAOx3,  NAD HEENT:Oral mucosa moist, Ear/Nose WNL grossly, dentition normal. Respiratory system: bilaterally clear,no wheezing or crackles,no use of accessory muscle Cardiovascular system: S1 & S2 +, No JVD,. Gastrointestinal system: Abdomen soft, NT,ND, BS+ Nervous System:Alert, awake, moving extremities and grossly nonfocal.  Decreased vision on the left eye with blurriness, able to see. Extremities: No edema, distal peripheral pulses palpable.  Skin: No rashes,no icterus. MSK: Normal muscle bulk,tone, power  Labs on Admission: I have personally reviewed following labs and imaging studies  CBC: Recent Labs  Lab 08/02/19 1237  WBC 4.3  NEUTROABS 1.6*  HGB 12.0  HCT 37.8  MCV 92.0  PLT 209  Basic Metabolic Panel: Recent Labs  Lab 08/02/19 1237  NA 136  K 4.0  CL 105  CO2 22  GLUCOSE 90  BUN 10  CREATININE 0.82  CALCIUM 9.0   GFR: Estimated Creatinine Clearance: 111.1 mL/min (by C-G formula based on SCr of 0.82  mg/dL). Liver Function Tests: No results for input(s): AST, ALT, ALKPHOS, BILITOT, PROT, ALBUMIN in the last 168 hours. No results for input(s): LIPASE, AMYLASE in the last 168 hours. No results for input(s): AMMONIA in the last 168 hours. Coagulation Profile: No results for input(s): INR, PROTIME in the last 168 hours. Cardiac Enzymes: No results for input(s): CKTOTAL, CKMB, CKMBINDEX, TROPONINI in the last 168 hours. BNP (last 3 results) No results for input(s): PROBNP in the last 8760 hours. HbA1C: No results for input(s): HGBA1C in the last 72 hours. CBG: No results for input(s): GLUCAP in the last 168 hours. Lipid Profile: No results for input(s): CHOL, HDL, LDLCALC, TRIG, CHOLHDL, LDLDIRECT in the last 72 hours. Thyroid Function Tests: No results for input(s): TSH, T4TOTAL, FREET4, T3FREE, THYROIDAB in the last 72 hours. Anemia Panel: No results for input(s): VITAMINB12, FOLATE, FERRITIN, TIBC, IRON, RETICCTPCT in the last 72 hours. Urine analysis: No results found for: COLORURINE, APPEARANCEUR, LABSPEC, PHURINE, GLUCOSEU, HGBUR, BILIRUBINUR, KETONESUR, PROTEINUR, UROBILINOGEN, NITRITE, LEUKOCYTESUR  Radiological Exams on Admission: MR Brain W and Wo Contrast  Result Date: 08/02/2019 CLINICAL DATA:  Optic neuritis suspected on the left. Two weeks duration. EXAM: MRI HEAD AND ORBITS WITHOUT AND WITH CONTRAST TECHNIQUE: Multiplanar, multiecho pulse sequences of the brain and surrounding structures were obtained without and with intravenous contrast. Multiplanar, multiecho pulse sequences of the orbits and surrounding structures were obtained including fat saturation techniques, before and after intravenous contrast administration. CONTRAST:  79mL GADAVIST GADOBUTROL 1 MMOL/ML IV SOLN COMPARISON:  None. FINDINGS: MRI HEAD FINDINGS Brain: No abnormality seen affecting the brainstem or cerebellum. Cerebral hemispheres show multiple foci of T2 and FLAIR signal affecting the deep and  subcortical white matter with a pattern typical of multiple sclerosis. There are approximately 15 lesions within each hemisphere. 5 small white matter lesions in the right frontal lobe show contrast enhancement. Left frontal subcortical white matter lesion shows contrast enhancement. No evidence of mass lesion, ischemic infarction, hemorrhage, hydrocephalus or extra-axial collection. Vascular: Major vessels at the base of the brain show flow. Skull and upper cervical spine: Negative Other: None MRI ORBITS FINDINGS Orbits: Globes are normal. Optic neuritis on the left involving a segment of the nerve approximately 1.2 cm in length with increased T2 signal and contrast enhancement, also consistent with multiple sclerosis involvement. Right optic nerve appears normal. Visualized sinuses: Paranasal sinuses are clear Soft tissues: No soft tissue lesion in the area. Limited intracranial: See results of brain MRI. IMPRESSION: Acute demyelinating disease/multiple sclerosis presentation. Multiple T2 and FLAIR bright lesions scattered throughout the cerebral hemispheric white matter with a pattern and distribution typical of multiple sclerosis. Approximately 15 lesions in each hemisphere. Scattered lesions show contrast enhancement, further evidence of active nature. Acute optic neuritis on the left affecting a 12 mm segment of the nerve, pre chiasmatic. Electronically Signed   By: Nelson Chimes M.D.   On: 08/02/2019 14:36   DG Chest Portable 1 View  Result Date: 08/02/2019 CLINICAL DATA:  Blurry vision MS EXAM: PORTABLE CHEST 1 VIEW COMPARISON:  None. FINDINGS: The heart size and mediastinal contours are within normal limits. Both lungs are clear. The visualized skeletal structures are unremarkable. IMPRESSION: No active disease. Electronically Signed  By: Jasmine Pang M.D.   On: 08/02/2019 15:16   MR ORBITS W WO CONTRAST  Result Date: 08/02/2019 CLINICAL DATA:  Optic neuritis suspected on the left. Two weeks  duration. EXAM: MRI HEAD AND ORBITS WITHOUT AND WITH CONTRAST TECHNIQUE: Multiplanar, multiecho pulse sequences of the brain and surrounding structures were obtained without and with intravenous contrast. Multiplanar, multiecho pulse sequences of the orbits and surrounding structures were obtained including fat saturation techniques, before and after intravenous contrast administration. CONTRAST:  84mL GADAVIST GADOBUTROL 1 MMOL/ML IV SOLN COMPARISON:  None. FINDINGS: MRI HEAD FINDINGS Brain: No abnormality seen affecting the brainstem or cerebellum. Cerebral hemispheres show multiple foci of T2 and FLAIR signal affecting the deep and subcortical white matter with a pattern typical of multiple sclerosis. There are approximately 15 lesions within each hemisphere. 5 small white matter lesions in the right frontal lobe show contrast enhancement. Left frontal subcortical white matter lesion shows contrast enhancement. No evidence of mass lesion, ischemic infarction, hemorrhage, hydrocephalus or extra-axial collection. Vascular: Major vessels at the base of the brain show flow. Skull and upper cervical spine: Negative Other: None MRI ORBITS FINDINGS Orbits: Globes are normal. Optic neuritis on the left involving a segment of the nerve approximately 1.2 cm in length with increased T2 signal and contrast enhancement, also consistent with multiple sclerosis involvement. Right optic nerve appears normal. Visualized sinuses: Paranasal sinuses are clear Soft tissues: No soft tissue lesion in the area. Limited intracranial: See results of brain MRI. IMPRESSION: Acute demyelinating disease/multiple sclerosis presentation. Multiple T2 and FLAIR bright lesions scattered throughout the cerebral hemispheric white matter with a pattern and distribution typical of multiple sclerosis. Approximately 15 lesions in each hemisphere. Scattered lesions show contrast enhancement, further evidence of active nature. Acute optic neuritis on the  left affecting a 12 mm segment of the nerve, pre chiasmatic. Electronically Signed   By: Paulina Fusi M.D.   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 Appreciate neurology input starting on high-dose Solu-Medrol 1 g x 5 days. We will continue plan as per neurology.  COVID-19 pending. UA pending, chest x-ray-NAD.neuromyelitis optica antibodies noted plan for MRI C-spine T-spine with and without contrast tomorrow.  She will need follow-up with outpatient [facility] for disease modifying therapy discussion and initiation.  PT OT and speech therapy.  Morbid obesity with BMI 35.4: She will benefit with weight loss and healthy lifestyle and counseling prior to discharge.  Neutropenia ANC 1600.  Monitor.  Severity of Illness: I certify that at the point of admission it is my clinical judgment that the patient will require inpatient hospital care spanning beyond 2 midnights from the point of admission due to high intensity of service, high risk for further deterioration and high frequency of surveillance required given her acute MS flareup and optic neuritis.   DVT prophylaxis: Lovenox Code Status: Full code Family Communication: Admission, patients condition and plan of care including tests being ordered have been discussed with the patient who indicate understanding and agree with the plan and Code Status.  Consults called:   Lanae Boast MD [facility] Pager [phone]  If 7PM-7AM, please contact night-coverage [email or web address]  08/02/2019, 3:53 PM

## 2019-08-02 NOTE — Consult Note (Signed)
Neurology Consultation  Reason for Consult: Left-sided painful visual loss Referring Physician: Dr. Rubin Payor, ED provider/Meena Luevenia Maxin PA-C-ED provider  CC: Blurred vision in the left eye, tingling and numbness in fingers  History is obtained from: Patient, chart  HPI: Carol Berger is a 29 y.o. female who has no past medical history, presented to the emergency room upon the recommendation of an ophthalmologist, who she had seen for left-sided visual loss. She reports that for the past couple of weeks, she has been having difficulty seeing out of her left eye and has had pain when moving that eye.  She went to see her ophthalmologist, who was concern for retrobulbar neuritis based on the exam and sent her for evaluation with further imaging of the brain and orbits. MRI brain and orbits was done which revealed multiple lesions consistent with demyelinating process in both cerebral hemispheres as well as an enhancing left optic nerve consistent with left optic neuritis. Upon further history taking, she reports that in 2016 she had an episode of numbness in her hands and was seen by her physician and told that she has carpal tunnel syndrome.  She has had episodes of hand and feet numbness ever since then. Denies any family history of MS but said that she will confirm with her parents and other family members. Does not know of anyone in the family with a history of lupus or any other autoimmune disease. Also has a history of frequent headaches and migraines since late teens. No history of pain in the neck or tingling/shocklike sensation on bending the neck.  ROS: Performed and negative except noted in HPI  History reviewed. No pertinent past medical history. No pertinent past medical history Family history: No pertinent family history.  Will talk to parents to get more family history.  Social History:   reports that she has never smoked. She has never used smokeless tobacco. She reports previous  alcohol use. She reports previous drug use.  Medications  Current Facility-Administered Medications:  .  methylPREDNISolone sodium succinate (SOLU-MEDROL) 1,000 mg in sodium chloride 0.9 % 50 mL IVPB, 1,000 mg, Intravenous, Once, Fawze, Mina A, PA-C  Current Outpatient Medications:  .  acetaminophen (TYLENOL) 500 MG tablet, Take 500-1,000 mg by mouth every 6 (six) hours as needed for mild pain, moderate pain or headache. , Disp: , Rfl:  .  norgestrel-ethinyl estradiol (CRYSELLE-28) 0.3-30 MG-MCG tablet, Take 1 tablet by mouth daily., Disp: , Rfl:   Exam: Current vital signs: BP 133/73 (BP Location: Left Arm)   Pulse 66   Temp 98.1 F (36.7 C) (Oral)   Resp 15   Ht 5\' 3"  (1.6 m)   Wt 90.7 kg   LMP 07/26/2019   SpO2 98%   BMI 35.43 kg/m  Vital signs in last 24 hours: Temp:  [98.1 F (36.7 C)] 98.1 F (36.7 C) (03/17 1137) Pulse Rate:  [66] 66 (03/17 1137) Resp:  [15] 15 (03/17 1137) BP: (133)/(73) 133/73 (03/17 1137) SpO2:  [98 %] 98 % (03/17 1137) Weight:  [90.7 kg] 90.7 kg (03/17 1137) General: Awake alert in no distress HEENT: Normocephalic atraumatic Lungs: Clear Cardiovascular: Regular rate rhythm Neurological exam Awake alert oriented x3 Speech is nondysarthric No aphasia Cranial nerves: Left RAPD.  Right pupil round and reactive to light direct and consensual.  Left visual acuity-can perceive light and count fingers more so in the nasal hemifield compared to the temporal hemifield, EOMI.  Face symmetric.  Tongue and palate midline.  Facial sensation intact. Motor  exam: Symmetric 5/5 in all fours. Sensory exam: Subjective tingling in both hands and feet.  Diminished laboratory signs in both lower extremities. Coordination: No dysmetria Gait testing deferred at this time  Labs I have reviewed labs in epic and the results pertinent to this consultation are: CBC    Component Value Date/Time   WBC 4.3 08/02/2019 1237   RBC 4.11 08/02/2019 1237   HGB 12.0  08/02/2019 1237   HCT 37.8 08/02/2019 1237   PLT 209 08/02/2019 1237   MCV 92.0 08/02/2019 1237   MCH 29.2 08/02/2019 1237   MCHC 31.7 08/02/2019 1237   RDW 14.1 08/02/2019 1237   LYMPHSABS 2.2 08/02/2019 1237   MONOABS 0.4 08/02/2019 1237   EOSABS 0.1 08/02/2019 1237   BASOSABS 0.0 08/02/2019 1237   CMP     Component Value Date/Time   NA 136 08/02/2019 1237   K 4.0 08/02/2019 1237   CL 105 08/02/2019 1237   CO2 22 08/02/2019 1237   GLUCOSE 90 08/02/2019 1237   BUN 10 08/02/2019 1237   CREATININE 0.82 08/02/2019 1237   CALCIUM 9.0 08/02/2019 1237   PROT 7.4 11/17/2018 0510   ALBUMIN 4.1 11/17/2018 0510   AST 22 11/17/2018 0510   ALT 16 11/17/2018 0510   ALKPHOS 39 11/17/2018 0510   BILITOT <0.1 (L) 11/17/2018 0510   GFRNONAA >60 08/02/2019 1237   GFRAA >60 08/02/2019 1237    Imaging I have reviewed the images obtained: MRI examination of the brain multiple T2 flair hyperintensities in periventricular and juxtacortical regions with few of them showing enhancement with contrast.  Also MRI of the orbits shows an enhancing left optic nerve-consistent with left optic neuritis.  Assessment:  29 year old with painful visual loss of the left eye, and imaging suggestive of demyelinating lesions in the brain and active demyelination as evidenced by enhancing lesions in the brain as well as enhancement of the left optic nerve-left optic neuritis. Tingling and numbness in the fingers and legs has been going on now for 4 to 5 years, had a diagnosis of carpal tunnel made at 1 point. Meets the current criteria for MS. Likely prior symptoms from 2016 were the first presenting features of her current demyelinating disease and this particular episode is a relapse.  Impression: Left optic neuritis Multiple sclerosis  Evaluate for neuromyelitis optica/neuromyelitis optica spectrum disorders.  Recommendations: Check UA chest x-ray High-dose steroids-IV Solu-Medrol 1 g IV x5 days. MRI  C-spine and T-spine with and without contrast tomorrow Check neuromyelitis optica antibodies Will defer LP to outpatient work-up Check ANA, ACE, B12 Follow-up with outpatient neurology-Dr. Marni Griffon neurology for disease modifying therapy discussion and initiation. PT OT speech therapy  Plan discussed with ED provider Rodell Perna PA-C  Neurology will follow  -- Amie Portland, MD [facility] Pager: [phone] If 7pm to 7am, please call on call as listed on AMION.

## 2019-08-02 NOTE — ED Provider Notes (Signed)
[facility] EMERGENCY DEPARTMENT Provider Note   CSN: [id number] Arrival date & time: 08/02/19  1132     History Chief Complaint  Patient presents with  . Blurred Vision    Carol Berger is a 29 y.o. female with no significant past medical history presenting today for evaluation of acute onset, progressively worsening left eye pain and vision loss.  Reports that symptoms began 2 weeks ago with left eye pain and pain with eye movements.  The pain has also cause left-sided headaches.  She reports that her she has fairly normal vision along the nasal aspect of the left eye but her peripheral vision is blurry.  She was seen and evaluated by Dr. Manuella Ghazi with ophthalmology today who recommended presentation to the ED for emergent evaluation to rule out MS/optic neuritis.  He recommends MRI brain and orbits with and without contrast.  Patient does note that she has had intermittent tingling in her fingers mostly her thumb and index fingers bilaterally for years and over the last few months also noticed tingling in her toes bilaterally.  She has been taking Tylenol for the headaches with improvement.  The history is provided by the patient.       History reviewed. No pertinent past medical history.  Patient Active Problem List   Diagnosis Date Noted  . Optic neuritis, left 08/02/2019  . Multiple sclerosis with acute neurologic event ([place]) 08/02/2019    Past Surgical History:  Procedure Laterality Date  . WISDOM TOOTH EXTRACTION       OB History    Gravida  1   Para      Term      Preterm      AB  1   Living        SAB  1   TAB      Ectopic      Multiple      Live Births              No family history on file.  Social History   Tobacco Use  . Smoking status: Never Smoker  . Smokeless tobacco: Never Used  Substance Use Topics  . Alcohol use: Not Currently  . Drug use: Not Currently    Home Medications Prior to Admission medications     Medication Sig Start Date End Date Taking? Authorizing Provider  acetaminophen (TYLENOL) 500 MG tablet Take 500-1,000 mg by mouth every 6 (six) hours as needed for mild pain, moderate pain or headache.    Yes [provider]  norgestrel-ethinyl estradiol (CRYSELLE-28) 0.3-30 MG-MCG tablet Take 1 tablet by mouth daily.   Yes [provider]    Allergies    Patient has no known allergies.  Review of Systems   Review of Systems  Constitutional: Negative for chills and fever.  Eyes: Positive for pain and visual disturbance. Negative for photophobia and redness.  Respiratory: Negative for shortness of breath.   Cardiovascular: Negative for chest pain.  Gastrointestinal: Negative for nausea and vomiting.  Neurological: Positive for numbness and headaches. Negative for weakness.  All other systems reviewed and are negative.   Physical Exam Updated Vital Signs BP 133/73 (BP Location: Left Arm)   Pulse 66   Temp 98.1 F (36.7 C) (Oral)   Resp 15   Ht '5\' 3"'  (1.6 m)   Wt 90.7 kg   LMP 07/26/2019   SpO2 98%   BMI 35.43 kg/m   Physical Exam Vitals and nursing note reviewed.  Constitutional:      General: She is not in acute distress.    Appearance: She is well-developed.  HENT:     Head: Normocephalic and atraumatic.  Eyes:     General:        Right eye: No discharge.        Left eye: No discharge.     Extraocular Movements: Extraocular movements intact.     Conjunctiva/sclera: Conjunctivae normal.     Pupils: Pupils are equal, round, and reactive to light.     Comments: Pain with left eye movement laterally and superiorly.  No chemosis, proptosis, or consensual photophobia.  Neck:     Vascular: No JVD.     Trachea: No tracheal deviation.  Cardiovascular:     Rate and Rhythm: Normal rate.  Pulmonary:     Effort: Pulmonary effort is normal.  Abdominal:     General: There is no distension.  Musculoskeletal:     Cervical back: Normal range of motion and  neck supple.  Skin:    General: Skin is warm and dry.     Findings: No erythema.  Neurological:     Mental Status: She is alert and oriented to person, place, and time.     Sensory: No sensory deficit.     Motor: No weakness.     Coordination: Coordination normal.     Comments: Mental Status:  Alert, thought content appropriate, able to give a coherent history. Speech fluent without evidence of aphasia. Able to follow 2 step commands without difficulty.  Cranial Nerves:  II: Decreased peripheral field vision of the left eye, pupils equal, round, reactive to light III,IV, VI: ptosis not present, extra-ocular motions intact bilaterally  V,VII: smile symmetric, facial light touch sensation equal VIII: hearing grossly normal to voice  X: uvula elevates symmetrically  XI: bilateral shoulder shrug symmetric and strong XII: midline tongue extension without fassiculations Motor:  Normal tone. 5/5 strength of BUE and BLE major muscle groups including strong and equal grip strength and dorsiflexion/plantar flexion Sensory: Subjectively altered sensation to light touch of the bilateral first and second digits of the upper extremities, otherwise sensation intact to light touch of upper and lower extremities bilaterally Cerebellar: normal finger-to-nose with bilateral upper extremities   Psychiatric:        Behavior: Behavior normal.     ED Results / Procedures / Treatments   Labs (all labs ordered are listed, but only abnormal results are displayed) Labs Reviewed  CBC WITH DIFFERENTIAL/PLATELET - Abnormal; Notable for the following components:      Result Value   Neutro Abs 1.6 (*)    All other components within normal limits  SARS CORONAVIRUS 2 (TAT 6-24 HRS)  BASIC METABOLIC PANEL  SEDIMENTATION RATE  C-REACTIVE PROTEIN  URINALYSIS, ROUTINE W REFLEX MICROSCOPIC  EXTRACTABLE NUCLEAR ANTIGEN ANTIBODY  ANTI-JO 1 ANTIBODY, IGG  ANGIOTENSIN CONVERTING ENZYME  NEUROMYELITIS OPTICA  AUTOAB, IGG  VITAMIN B12  HIV ANTIBODY (ROUTINE TESTING W REFLEX)  CBC  CREATININE, SERUM  I-STAT BETA HCG BLOOD, ED ([facility], [facility], [facility] ONLY)    EKG None  Radiology MR Brain W and Wo Contrast  Result Date: 08/02/2019 CLINICAL DATA:  Optic neuritis suspected on the left. Two weeks duration. EXAM: MRI HEAD AND ORBITS WITHOUT AND WITH CONTRAST TECHNIQUE: Multiplanar, multiecho pulse sequences of the brain and surrounding structures were obtained without and with intravenous contrast. Multiplanar, multiecho pulse sequences of the orbits and surrounding structures were obtained including fat saturation techniques, before and after  intravenous contrast administration. CONTRAST:  19m GADAVIST GADOBUTROL 1 MMOL/ML IV SOLN COMPARISON:  None. FINDINGS: MRI HEAD FINDINGS Brain: No abnormality seen affecting the brainstem or cerebellum. Cerebral hemispheres show multiple foci of T2 and FLAIR signal affecting the deep and subcortical white matter with a pattern typical of multiple sclerosis. There are approximately 15 lesions within each hemisphere. 5 small white matter lesions in the right frontal lobe show contrast enhancement. Left frontal subcortical white matter lesion shows contrast enhancement. No evidence of mass lesion, ischemic infarction, hemorrhage, hydrocephalus or extra-axial collection. Vascular: Major vessels at the base of the brain show flow. Skull and upper cervical spine: Negative Other: None MRI ORBITS FINDINGS Orbits: Globes are normal. Optic neuritis on the left involving a segment of the nerve approximately 1.2 cm in length with increased T2 signal and contrast enhancement, also consistent with multiple sclerosis involvement. Right optic nerve appears normal. Visualized sinuses: Paranasal sinuses are clear Soft tissues: No soft tissue lesion in the area. Limited intracranial: See results of brain MRI. IMPRESSION: Acute demyelinating disease/multiple sclerosis presentation. Multiple T2 and FLAIR  bright lesions scattered throughout the cerebral hemispheric white matter with a pattern and distribution typical of multiple sclerosis. Approximately 15 lesions in each hemisphere. Scattered lesions show contrast enhancement, further evidence of active nature. Acute optic neuritis on the left affecting a 12 mm segment of the nerve, pre chiasmatic. Electronically Signed   By: MNelson ChimesM.D.   On: 08/02/2019 14:36   DG Chest Portable 1 View  Result Date: 08/02/2019 CLINICAL DATA:  Blurry vision MS EXAM: PORTABLE CHEST 1 VIEW COMPARISON:  None. FINDINGS: The heart size and mediastinal contours are within normal limits. Both lungs are clear. The visualized skeletal structures are unremarkable. IMPRESSION: No active disease. Electronically Signed   By: KDonavan FoilM.D.   On: 08/02/2019 15:16   MR ORBITS W WO CONTRAST  Result Date: 08/02/2019 CLINICAL DATA:  Optic neuritis suspected on the left. Two weeks duration. EXAM: MRI HEAD AND ORBITS WITHOUT AND WITH CONTRAST TECHNIQUE: Multiplanar, multiecho pulse sequences of the brain and surrounding structures were obtained without and with intravenous contrast. Multiplanar, multiecho pulse sequences of the orbits and surrounding structures were obtained including fat saturation techniques, before and after intravenous contrast administration. CONTRAST:  975mGADAVIST GADOBUTROL 1 MMOL/ML IV SOLN COMPARISON:  None. FINDINGS: MRI HEAD FINDINGS Brain: No abnormality seen affecting the brainstem or cerebellum. Cerebral hemispheres show multiple foci of T2 and FLAIR signal affecting the deep and subcortical white matter with a pattern typical of multiple sclerosis. There are approximately 15 lesions within each hemisphere. 5 small white matter lesions in the right frontal lobe show contrast enhancement. Left frontal subcortical white matter lesion shows contrast enhancement. No evidence of mass lesion, ischemic infarction, hemorrhage, hydrocephalus or extra-axial  collection. Vascular: Major vessels at the base of the brain show flow. Skull and upper cervical spine: Negative Other: None MRI ORBITS FINDINGS Orbits: Globes are normal. Optic neuritis on the left involving a segment of the nerve approximately 1.2 cm in length with increased T2 signal and contrast enhancement, also consistent with multiple sclerosis involvement. Right optic nerve appears normal. Visualized sinuses: Paranasal sinuses are clear Soft tissues: No soft tissue lesion in the area. Limited intracranial: See results of brain MRI. IMPRESSION: Acute demyelinating disease/multiple sclerosis presentation. Multiple T2 and FLAIR bright lesions scattered throughout the cerebral hemispheric white matter with a pattern and distribution typical of multiple sclerosis. Approximately 15 lesions in each hemisphere. Scattered lesions show contrast enhancement,  further evidence of active nature. Acute optic neuritis on the left affecting a 12 mm segment of the nerve, pre chiasmatic. Electronically Signed   By: Nelson Chimes M.D.   On: 08/02/2019 14:36    Procedures Procedures (including critical care time)  Medications Ordered in ED Medications  methylPREDNISolone sodium succinate (SOLU-MEDROL) 1,000 mg in sodium chloride 0.9 % 50 mL IVPB (has no administration in time range)  enoxaparin (LOVENOX) injection 40 mg (has no administration in time range)  senna-docusate (Senokot-S) tablet 1 tablet (has no administration in time range)  HYDROcodone-acetaminophen (NORCO/VICODIN) 5-325 MG per tablet 1-2 tablet (has no administration in time range)  acetaminophen (TYLENOL) tablet 650 mg (has no administration in time range)    Or  acetaminophen (TYLENOL) suppository 650 mg (has no administration in time range)  zolpidem (AMBIEN) tablet 5 mg (has no administration in time range)  ondansetron (ZOFRAN) tablet 4 mg (has no administration in time range)    Or  ondansetron (ZOFRAN) injection 4 mg (has no administration  in time range)  gadobutrol (GADAVIST) 1 MMOL/ML injection 9 mL (9 mLs Intravenous Contrast Given 08/02/19 1422)    ED Course  I have reviewed the triage vital signs and the nursing notes.  Pertinent labs & imaging results that were available during my care of the patient were reviewed by me and considered in my medical decision making (see chart for details).    MDM Rules/Calculators/A&P                      Patient presents sent from ophthalmologist for evaluation of left eye pain and vision loss.  Concern for optic neuritis/MS.  Patient is afebrile, vital signs are stable.  She is nontoxic in appearance.  Has experienced paresthesias of the bilateral upper extremities since 2016.  Now has worsening paresthesias in her lower extremities over the last couple of months.  Unilateral eye pain and vision loss has been present for approximately 2 weeks.  Lab work reviewed and interpreted by myself shows no leukocytosis, no anemia, no metabolic derangements, no renal insufficiency.  Her ESR and CRP are within normal limits.  Imaging is concerning for demyelinating disease suggestive of MS, also shows evidence of left optic neuritis.  CONSULT: Spoke with Dr. Malen Gauze who recommends obtaining UA and chest x-ray to rule out any underlying infectious processes that may require treatment.  He recommends giving 1 g of Solu-Medrol daily for 5 days.  The patient will be evaluated by the neurology service emergently but will require admission to the hospitalist service.  Spoke with Dr. Maren Beach with [facility] service who agrees to assume care of patient and bring her into the hospital for further evaluation and management.  Final Clinical Impression(s) / ED Diagnoses Final diagnoses:  MS (multiple sclerosis) ([place])  Optic neuritis    Rx / DC Orders ED Discharge Orders    None       Debroah Baller 08/02/19 1616    Davonna Belling, MD 08/03/19 2333

## 2019-08-02 NOTE — ED Notes (Signed)
Lab called to report that HIV, B12 and creatine are hemolyzed and need to be redrawn.

## 2019-08-02 NOTE — ED Triage Notes (Signed)
Pt sent from ophthalmologist for MRI due to optic neuritis.  Reports L eye pain and blurred vision x 2 weeks.  Also c/o tingling to hands for years and bilateral toes x 1 month.

## 2019-08-03 ENCOUNTER — Inpatient Hospital Stay (HOSPITAL_COMMUNITY): Payer: Managed Care, Other (non HMO)

## 2019-08-03 DIAGNOSIS — G35 Multiple sclerosis: Secondary | ICD-10-CM

## 2019-08-03 DIAGNOSIS — E66812 Obesity, class 2: Secondary | ICD-10-CM | POA: Diagnosis present

## 2019-08-03 DIAGNOSIS — H469 Unspecified optic neuritis: Secondary | ICD-10-CM

## 2019-08-03 DIAGNOSIS — E669 Obesity, unspecified: Secondary | ICD-10-CM

## 2019-08-03 LAB — COMPREHENSIVE METABOLIC PANEL
ALT: 17 U/L (ref 0–44)
AST: 20 U/L (ref 15–41)
Albumin: 3.9 g/dL (ref 3.5–5.0)
Alkaline Phosphatase: 51 U/L (ref 38–126)
Anion gap: 10 (ref 5–15)
BUN: 11 mg/dL (ref 6–20)
CO2: 22 mmol/L (ref 22–32)
Calcium: 9.8 mg/dL (ref 8.9–10.3)
Chloride: 106 mmol/L (ref 98–111)
Creatinine, Ser: 0.9 mg/dL (ref 0.44–1.00)
GFR calc Af Amer: >60 mL/min (ref >60)
GFR calc non Af Amer: >60 mL/min (ref >60)
Glucose, Bld: 122 mg/dL — ABNORMAL HIGH (ref 70–99)
Potassium: 4.2 mmol/L (ref 3.5–5.1)
Sodium: 138 mmol/L (ref 135–145)
Total Bilirubin: 0.5 mg/dL (ref 0.3–1.2)
Total Protein: 7.7 g/dL (ref 6.5–8.1)

## 2019-08-03 LAB — EXTRACTABLE NUCLEAR ANTIGEN ANTIBODY
ENA SM Ab Ser-aCnc: <0.2 AI (ref 0.0–0.9)
Ribonucleic Protein: <0.2 AI (ref 0.0–0.9)
SSA (Ro) (ENA) Antibody, IgG: 0.2 AI (ref 0.0–0.9)
SSB (La) (ENA) Antibody, IgG: <0.2 AI (ref 0.0–0.9)
Scleroderma (Scl-70) (ENA) Antibody, IgG: <0.2 AI (ref 0.0–0.9)
ds DNA Ab: 12 IU/mL — ABNORMAL HIGH (ref 0–9)

## 2019-08-03 LAB — ANTI-JO 1 ANTIBODY, IGG: Anti JO-1: <0.2 AI (ref 0.0–0.9)

## 2019-08-03 IMAGING — MR MR THORACIC SPINE WO/W CM
8 of 16 series · 16 of 48 positions shown · IV contrast (gadavist)
Comparison: None.

:
CLINICAL DATA: Demyelinating disease

EXAM:
MRI CERVICAL AND THORACIC SPINE WITH AND WITHOUT CONTRAST
TECHNIQUE: Multiplanar and multiecho pulse sequences of the cervical spine, to
include the craniocervical junction and cervicothoracic junction,
and the thoracic spine, were obtained with and without intravenous
contrast.
CONTRAST:   9 mL Gadavist

[Series 25: T2 · sagittal · 3.0mm · 0.76mm/px · 1 of 17 slices shown (1 of 2)]
[im 1/17]
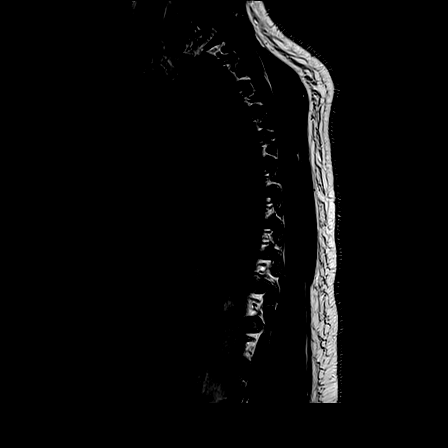

[Series 26: T1 · sagittal · 3.0mm · 0.76mm/px · 1 of 17 slices shown (1 of 2)]
[im 1/17]
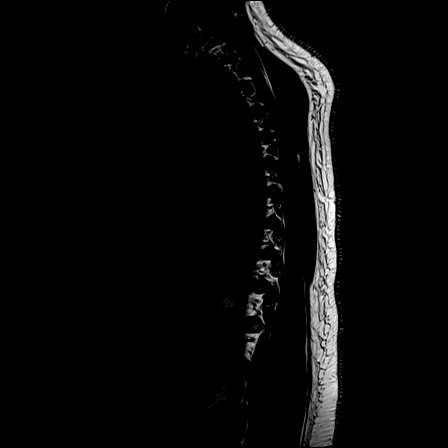

[Series 27: STIR · sagittal · 3.0mm · 0.38mm/px · 1 of 17 slices shown]
[im 1/17]
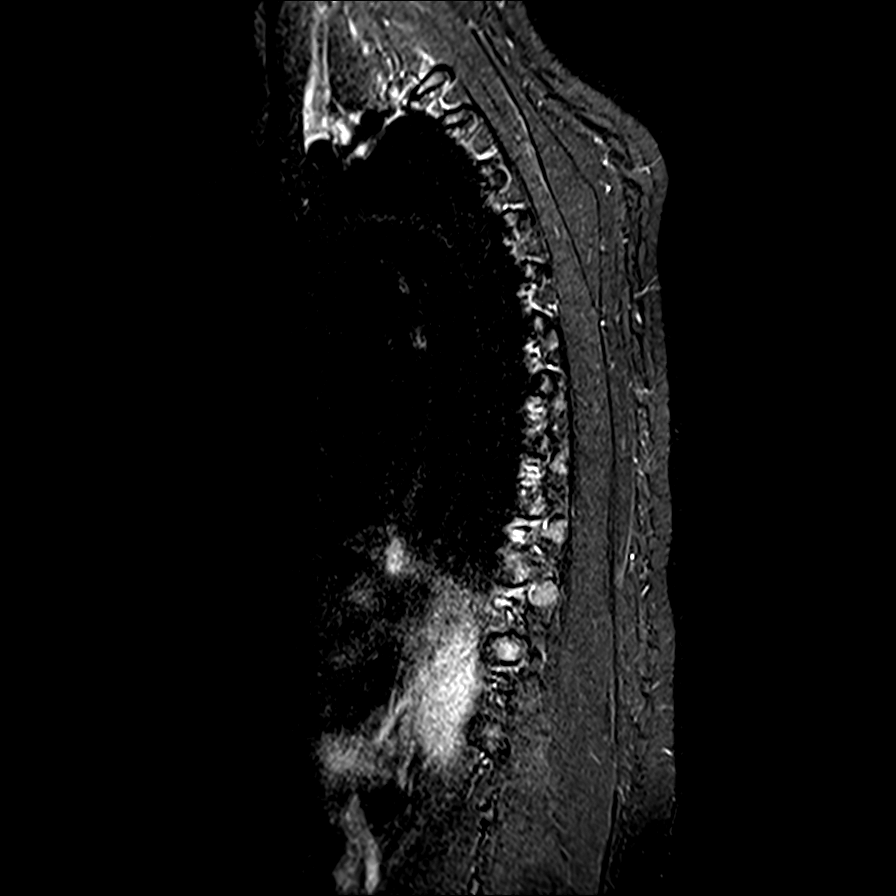

[Series 28: T2 · axial · 4.0mm · 0.59mm/px · z∈[-347,-108]mm · 3 of 39 slices shown (2 of 2)]
[im 1/39]
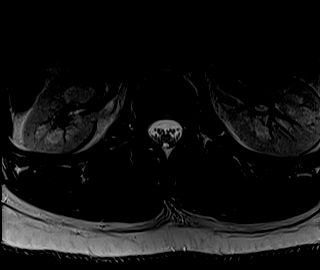
[im 20/39]
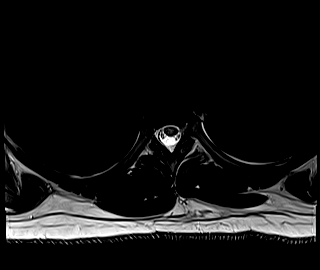
[im 39/39]
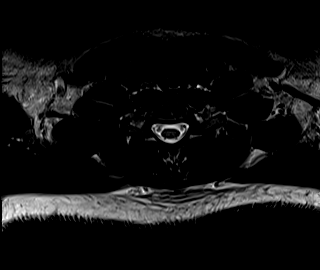

[Series 29: GRE · axial · 4.0mm · 0.37mm/px · z∈[-344,-113]mm · 3 of 39 slices shown]
[im 1/39]
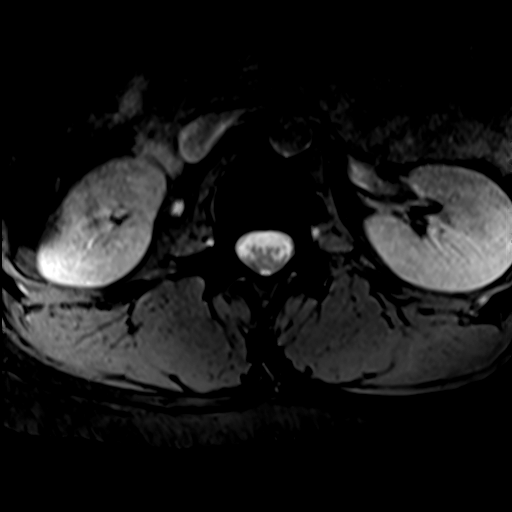
[im 20/39]
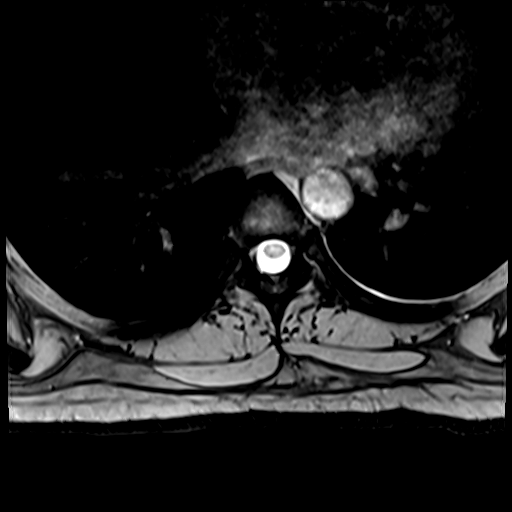
[im 39/39]
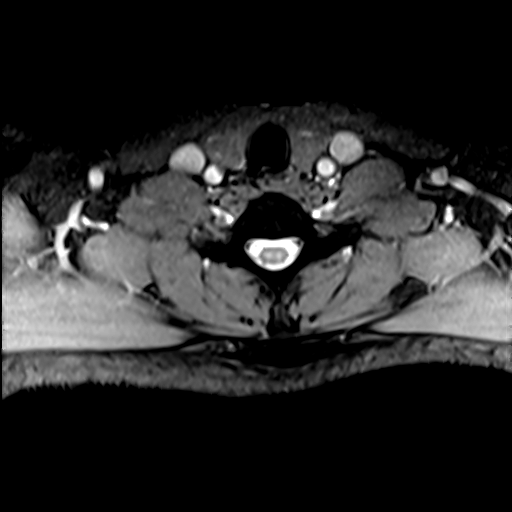

[Series 30: T1 · axial · non-contrast · 4.0mm · 0.31mm/px · z∈[-344,-113]mm · 3 of 39 slices shown (2 of 2)]
[im 1/39]
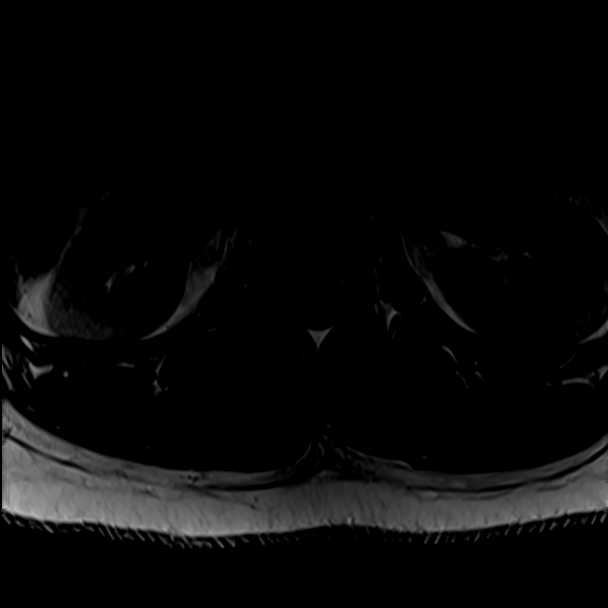
[im 20/39]
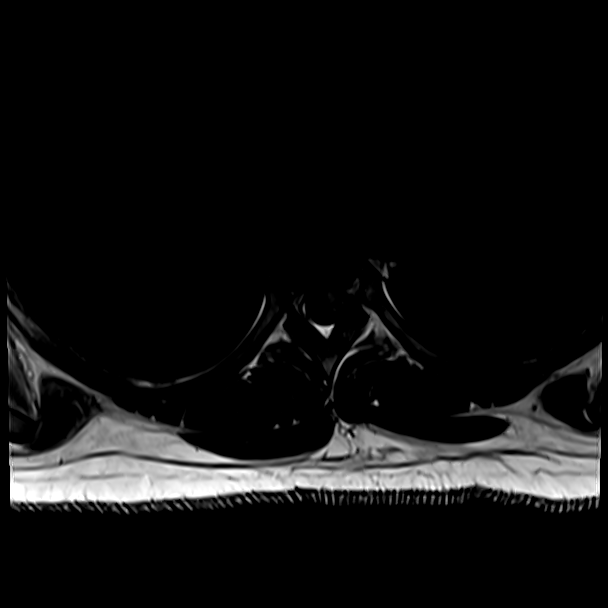
[im 39/39]
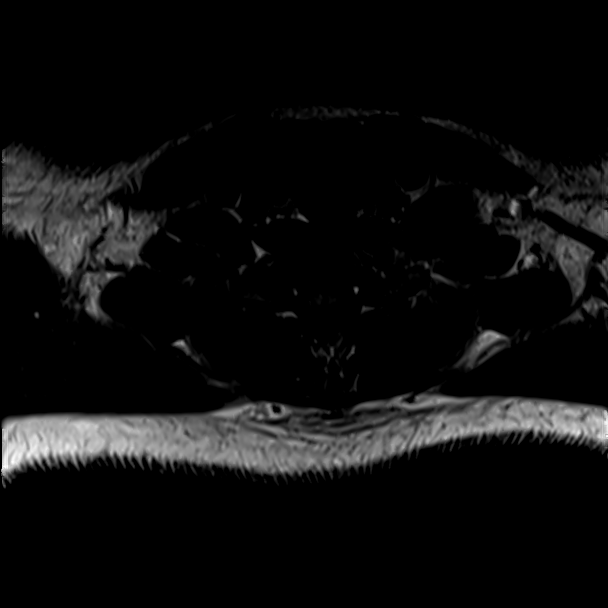

[Series 31: T1 post-contrast · axial · 4.0mm · 0.31mm/px · z∈[-344,-113]mm · 3 of 39 slices shown]
[im 1/39]
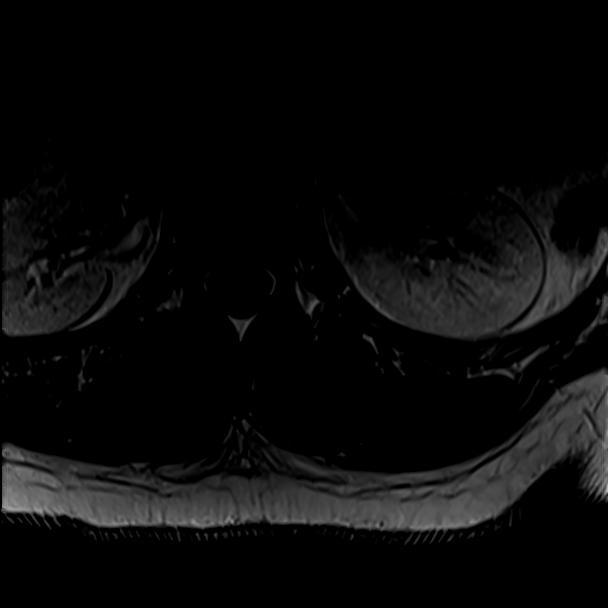
[im 20/39]
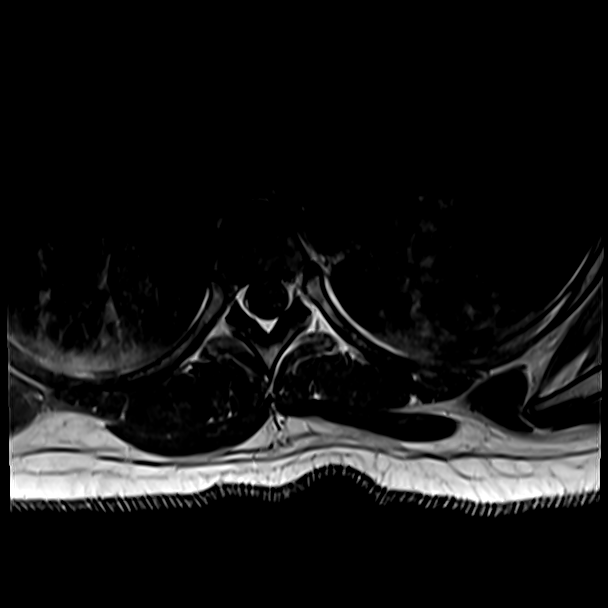
[im 39/39]
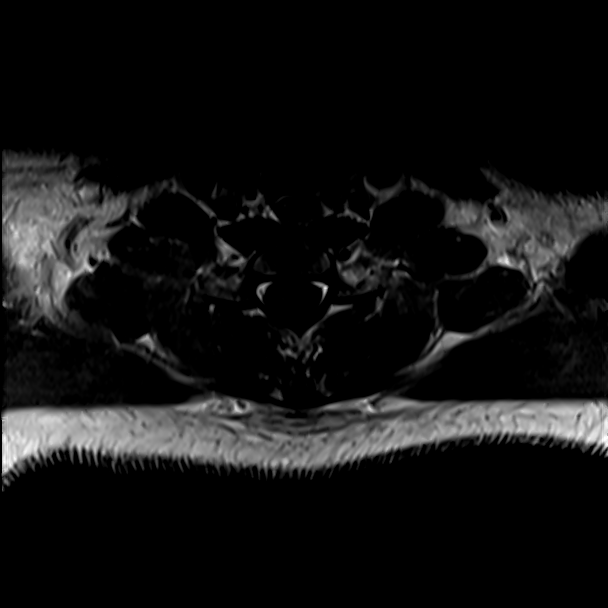

[Series 32: T1 fat-sat post-contrast · sagittal · 3.0mm · 0.76mm/px · 1 of 17 slices shown]
[im 1/17]
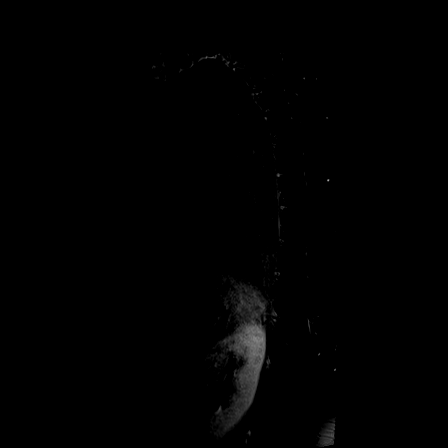

[16 of 48 positions shown; findings below may reference images not displayed]

FINDINGS: MRI CERVICAL SPINE FINDINGS

Alignment: Physiologic.

Vertebrae: No fracture, evidence of discitis, or bone lesion.

Cord: Large white matter lesion within the central spinal cord at

the C2-3 levels. Small lesion at the C6 level.

Posterior Fossa, vertebral arteries, paraspinal tissues: Negative

Disc levels:

C1-C2: Unremarkable.

C2-C3: Normal disc space and facet joints. There is no spinal canal

stenosis. No neural foraminal stenosis.

C3-C4: Normal disc space and facet joints. There is no spinal canal

stenosis. No neural foraminal stenosis.

C4-C5: Normal disc space and facet joints. There is no spinal canal

stenosis. No neural foraminal stenosis.

C5-C6: Normal disc space and facet joints. There is no spinal canal

stenosis. No neural foraminal stenosis.

C6-C7: Normal disc space and facet joints. There is no spinal canal

stenosis. No neural foraminal stenosis.

C7-T1: Normal disc space and facet joints. There is no spinal canal

stenosis. No neural foraminal stenosis.

MRI THORACIC SPINE FINDINGS

Alignment:   Physiologic.

Vertebrae: No fracture, evidence of discitis, or bone lesion.

Cord:   Lesion within the dorsal spinal cord at T6 and T11 levels.

Paraspinal and other soft tissues: Negative

Disc levels:

No disc herniation or stenosis.
IMPRESSION: 1. Multifocal white matter lesions within the cervical and thoracic

spinal cord, in keeping with demyelinating disease.

2. No contrast-enhancing lesions to indicate active demyelination

## 2019-08-03 IMAGING — MR MR CERVICAL SPINE WO/W CM
10 of 17 series · 26 of 48 positions shown · IV contrast (gadavist)
Comparison: None.

CLINICAL DATA: Myelin disease

EXAM:
MRI CERVICAL AND THORACIC SPINE WITH AND WITHOUT CONTRAST
TECHNIQUE: Multiplanar and multiecho pulse sequences of the cervical spine, to
include the craniocervical junction and cervicothoracic junction,
and the thoracic spine, were obtained with and without intravenous
contrast.
CONTRAST:  9 mL Gadavist

[Series 5: T2 · sagittal · 3.0mm · 0.69mm/px · 1 of 15 slices shown (1 of 4)]
[im 1/15]
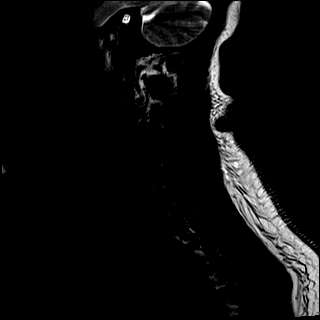

[Series 6: T1 · sagittal · 3.0mm · 0.69mm/px · 2 of 15 slices shown (1 of 5)]
[im 1/15]
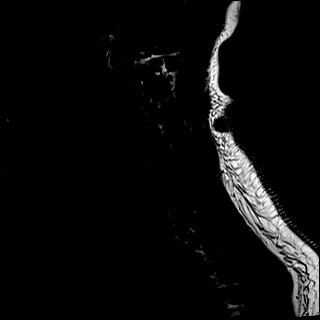
[im 15/15]
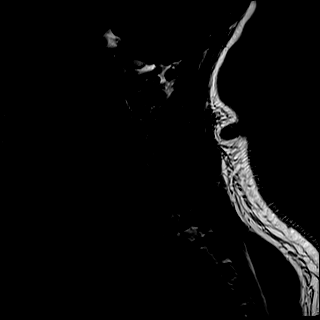

[Series 8: T2 · axial · 3.0mm · 0.66mm/px · z∈[-143,-20]mm · 4 of 40 slices shown (2 of 4)]
[im 1/40]
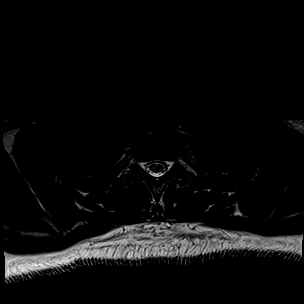
[im 14/40]
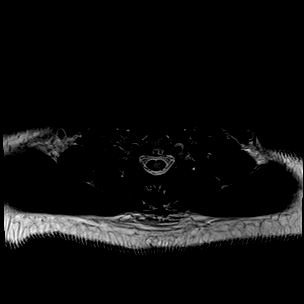
[im 27/40]
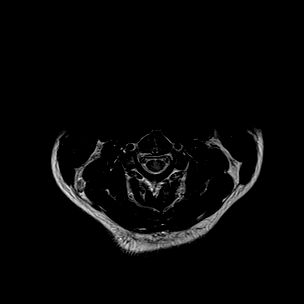
[im 40/40]
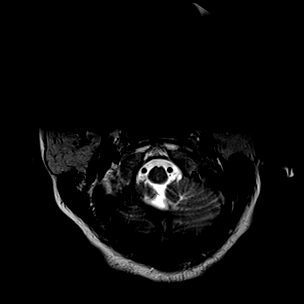

[Series 10: T1 · axial · 3.0mm · 0.39mm/px · z∈[-143,-20]mm · 4 of 40 slices shown (2 of 5)]
[im 1/40]
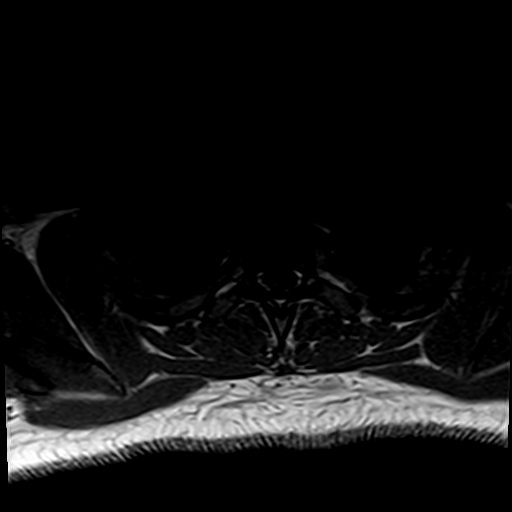
[im 14/40]
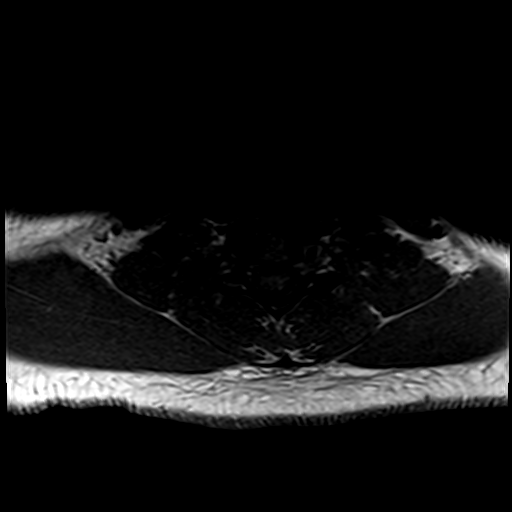
[im 27/40]
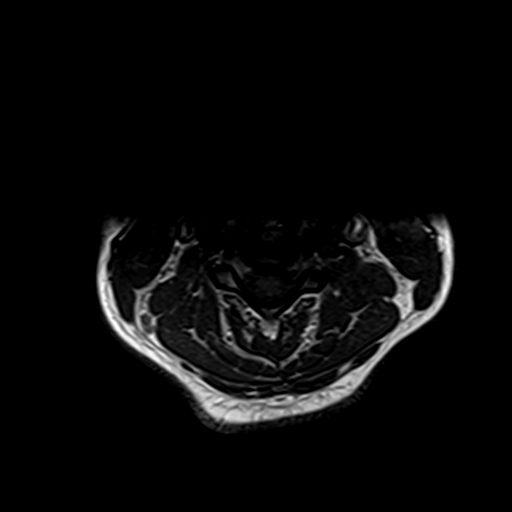
[im 40/40]
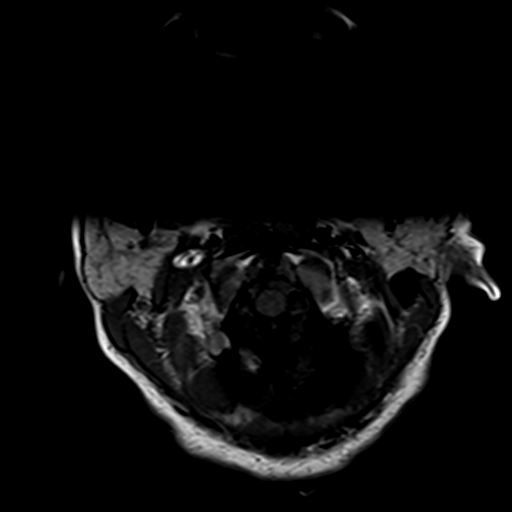

[Series 24: T1 · sagittal · 6.0mm · 1.23mm/px · 1 of 9 slices shown (3 of 5)]
[im 1/9]
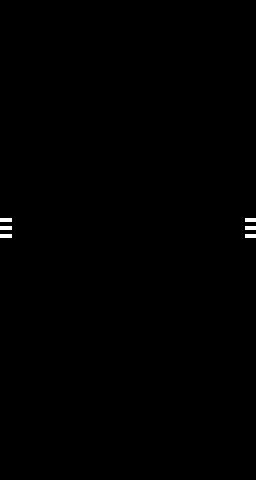

[Series 25: T2 · sagittal · 3.0mm · 0.76mm/px · 2 of 17 slices shown (3 of 4)]
[im 1/17]
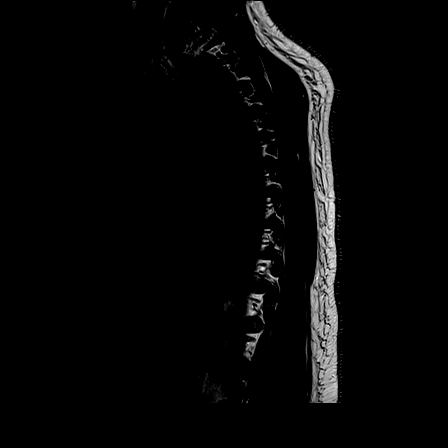
[im 17/17]
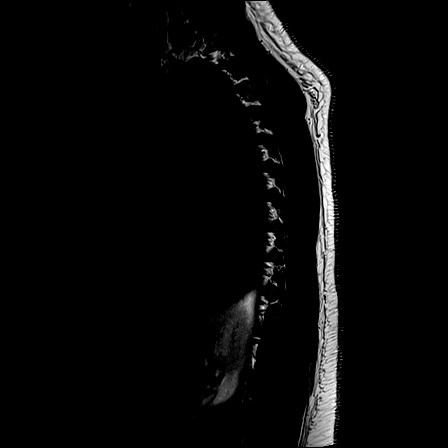

[Series 26: T1 · sagittal · 3.0mm · 0.76mm/px · 2 of 17 slices shown (4 of 5)]
[im 1/17]
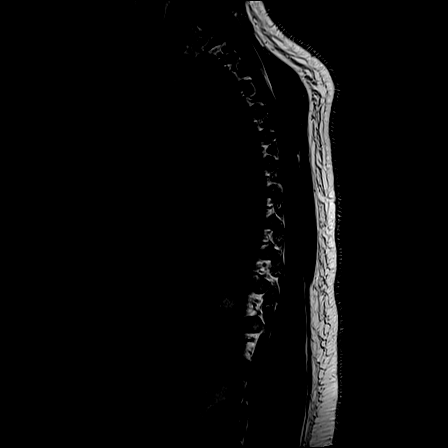
[im 17/17]
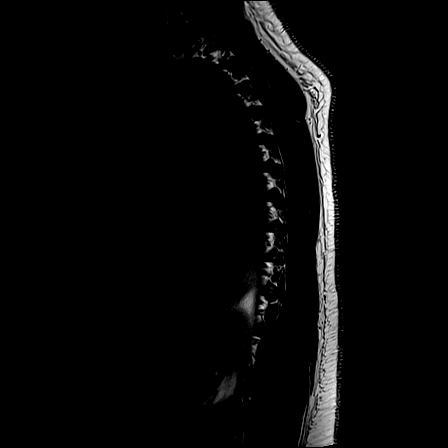

[Series 28: T2 · axial · 4.0mm · 0.59mm/px · z∈[-347,-108]mm · 4 of 39 slices shown (4 of 4)]
[im 1/39]
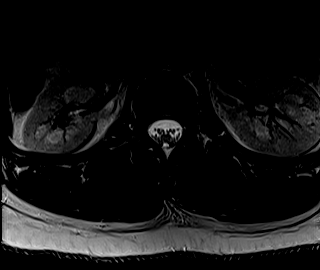
[im 13/39]
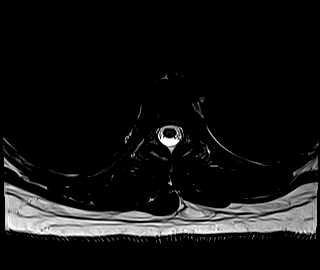
[im 26/39]
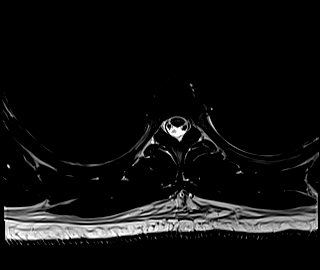
[im 39/39]
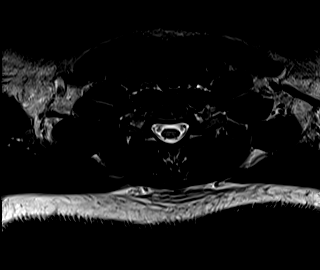

[Series 30: T1 · axial · non-contrast · 4.0mm · 0.31mm/px · z∈[-344,-113]mm · 4 of 39 slices shown (5 of 5)]
[im 1/39]
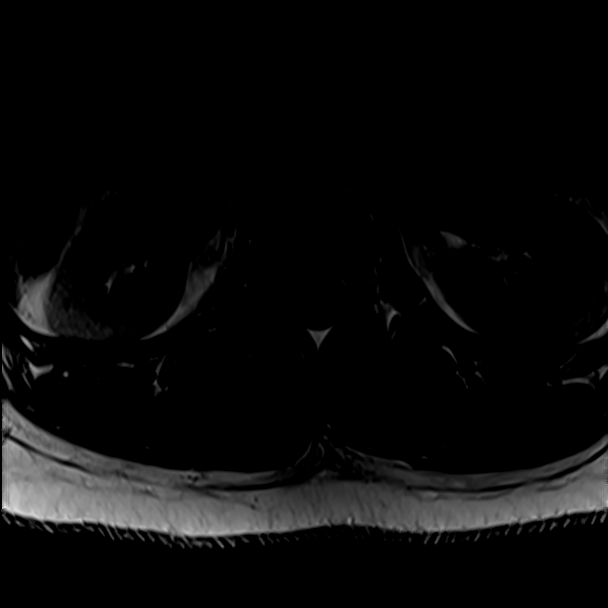
[im 13/39]
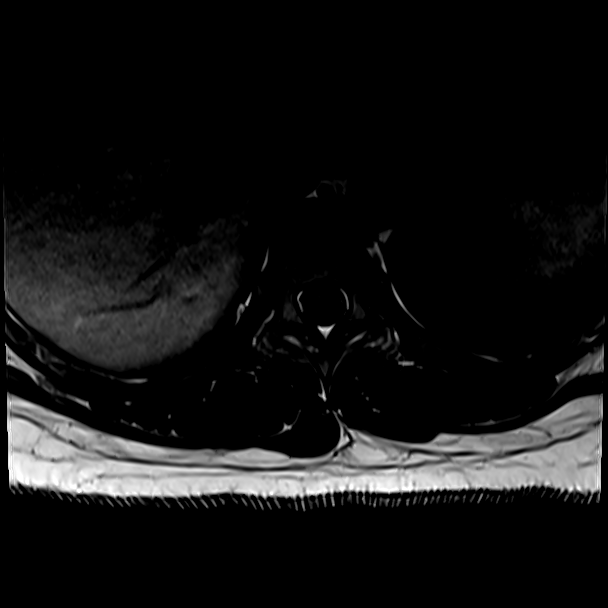
[im 26/39]
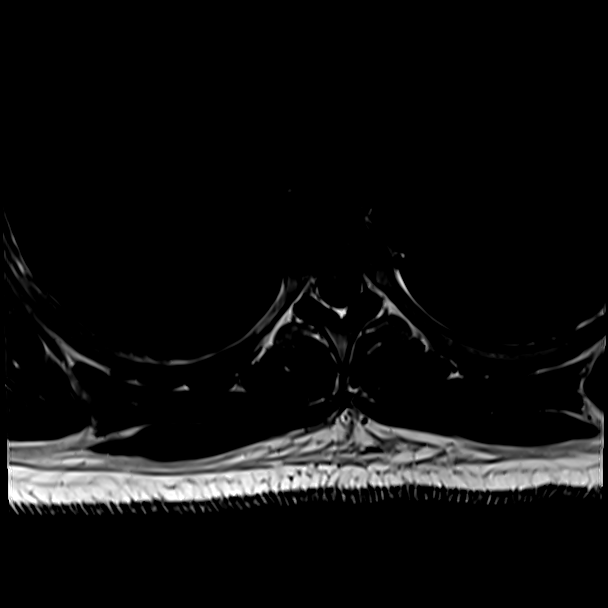
[im 39/39]
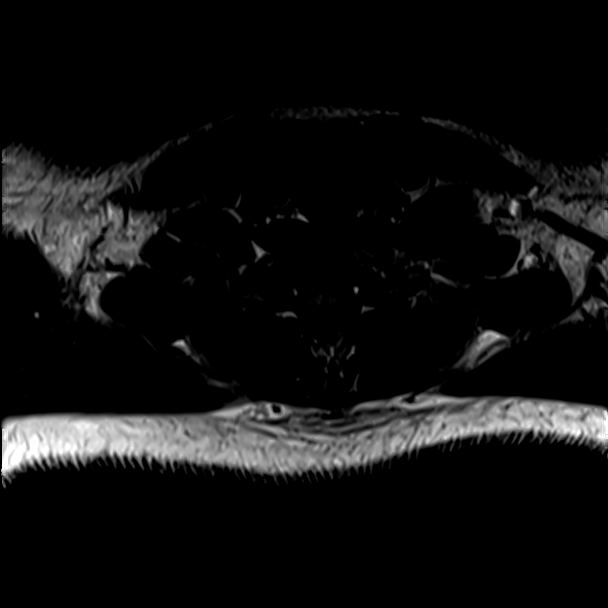

[Series 32: T1 fat-sat post-contrast · sagittal · 3.0mm · 0.76mm/px · 2 of 17 slices shown]
[im 1/17]
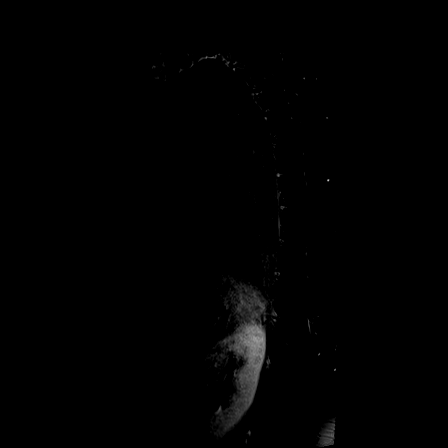
[im 17/17]
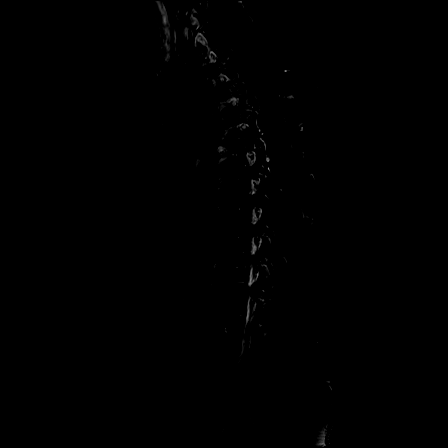

[26 of 48 positions shown; findings below may reference images not displayed]

FINDINGS: MRI CERVICAL SPINE FINDINGS

Alignment: Physiologic.

Vertebrae: No fracture, evidence of discitis, or bone lesion.

Cord: Large white matter lesion within the central spinal cord at
the C2-3 levels. Small lesion at the C6 level.

Posterior Fossa, vertebral arteries, paraspinal tissues: Negative

Disc levels:

C1-C2: Unremarkable.

C2-C3: Normal disc space and facet joints. There is no spinal canal
stenosis. No neural foraminal stenosis.

C3-C4: Normal disc space and facet joints. There is no spinal canal
stenosis. No neural foraminal stenosis.

C4-C5: Normal disc space and facet joints. There is no spinal canal
stenosis. No neural foraminal stenosis.

C5-C6: Normal disc space and facet joints. There is no spinal canal
stenosis. No neural foraminal stenosis.

C6-C7: Normal disc space and facet joints. There is no spinal canal
stenosis. No neural foraminal stenosis.

C7-T1: Normal disc space and facet joints. There is no spinal canal
stenosis. No neural foraminal stenosis.

MRI THORACIC SPINE FINDINGS

Alignment:  Physiologic.

Vertebrae: No fracture, evidence of discitis, or bone lesion.

Cord:  Lesion within the dorsal spinal cord at T6 and T11 levels.

Paraspinal and other soft tissues: Negative

Disc levels:

No disc herniation or stenosis.
IMPRESSION: 1. Multifocal white matter lesions within the cervical and thoracic
spinal cord, in keeping with demyelinating disease.
2. No contrast-enhancing lesions to indicate active demyelination

## 2019-08-03 MED ORDER — GADOBUTROL 1 MMOL/ML IV SOLN
9.0000 mL | Freq: Once | INTRAVENOUS | Status: AC | PRN
  Administered 2019-08-03: 9 mL via INTRAVENOUS

## 2019-08-03 MED ORDER — SODIUM CHLORIDE 0.9 % IV SOLN: 1000.0000 mg | Freq: Every day | INTRAVENOUS | 0 refills | Status: AC

## 2019-08-03 NOTE — TOC Transition Note (Signed)
Transition of Care [facility]) - CM/SW Discharge Note   Patient Details  Name: Carol Berger MRN: [id number] Date of Birth: [birth year]  [facility] CM/SW Contact:  Kermit Balo, RN Phone Number: 08/03/2019, 4:48 PM   Clinical Narrative:    Pt discharging home today after her 2 nd dose of Solumedrol . She will administer the last 3 doses at home. Pt will d/c with her peripheral IV. Pam with [employer] has provided her and her boyfriend education on administration of the medication. Pt has number to call if any issues with the IV site and a RN will come to her home. Boyfriend educated on d/cing the IV once Solumedrol doses are complete.  Medication will be delivered to her home tomorrow.  She has transport home.   Final next level of care: Home/Self Care Barriers to Discharge: No Barriers Identified   Patient Goals and CMS Choice        Discharge Placement                       Discharge Plan and Services                                     Social Determinants of Health (SDOH) Interventions     Readmission Risk Interventions No flowsheet data found.

## 2019-08-03 NOTE — Progress Notes (Signed)
Subjective: Less pain with eye movement today.   Exam: Vitals:   08/03/19 0331 08/03/19 0857  BP: 112/62 123/71  Pulse: 68 65  Resp: 17 16  Temp: 98.2 F (36.8 C) 98.4 F (36.9 C)  SpO2: 100% 100%   Gen: In bed, NAD Resp: non-labored breathing, no acute distress Abd: soft, nt  Neuro: MS: Awake, alert, interactive and appropriate CN:APD on left, she is able to count fingers in all 4 fields on the left, face symmetric Motor: 5/5 throughout Sensory:dcreased to temp in th R > L distal legs.   Pertinent Labs: ESR, CRP - negaitve.   Impression: 29 yo F with at least one previous clinical episode now with optic neuritis and multiple enhancing as well as non-enhancing lesions. This is consistent with multiple sclerosis. She will need 5 days of IV solumedrol for her optic neuritis, but if this can be arranged as an outpatient, she does not need to be inpatient the full time.    Recommendations: 1) IV solumedrol 1g daily x 5 days.  2) Will need outpatient follow up for disease modifying therapy.   Roland Rack, MD [facility] [phone]  If 7pm- 7am, please page neurology on call as listed in [facility].

## 2019-08-03 NOTE — Progress Notes (Signed)
Patient being discharged home with self care. Education and information provided to pt. All belongings with patient. Pt leaving unit ambulatory.

## 2019-08-03 NOTE — Progress Notes (Signed)
MD consulted CM about potentially letting patient d/c home and continue the last 3 doses of IV solumedrol at home. CM spoke with the patient and she is interested in this option. CM has contacted Pam with [employer]. She will meet and educate the patient to see if this can be arranged. TOC following.

## 2019-08-03 NOTE — Discharge Summary (Signed)
Physician Discharge Summary  Neilani Duffee ZOX:[id number] DOB: 1991-04-11 DOA: 08/02/2019  PCP: Patient, No Pcp Per  Admit date: 08/02/2019 Discharge date: 08/03/2019  Admitted From: home Disposition:  home  Recommendations for Outpatient Follow-up:  1. Follow up with PCP in 1-2 weeks 2. Please obtain BMP/CBC in one week 3. Patient will complete remaining 3 doses of solumedrol at home through peripheral IV. Nursing service provided by [employer]. Patient educated prior to discharge 4. She has been referred to outpatient neurology for follow up   Discharge Condition:stable CODE STATUS:full code Diet recommendation: regular diet  Brief/Interim Summary: 29 year old female with no significant past medical history, presents to the emergency room upon the recommendations of her ophthalmologist when she began developing left-sided visual loss.  She reported that for the past several weeks, she has been having difficulty seeing out of her left eye and had pain when moving her left eye.  She was seen by her ophthalmologist and was promptly referred to the emergency room upon arrival to the emergency room, MRI brain revealed multiple lesions consistent with a demyelinating process in both cerebral hemispheres as well as enhancing left optic nerve consistent with left optic neuritis.  The patient was seen by neurology in the emergency room and was admitted for further treatment with intravenous steroids.  After she received her first dose of Solu-Medrol, she reported improvement of her eye pain as well as blurry vision.  She feels significantly improved.  Neurology recommendations were to continue intravenous steroids for a total of 5 days.  Patient was interested in discharging home and continuing therapy as an outpatient.  Neurology was in agreement with this plan.  Arrangements were made with case management for the patient to receive Solu-Medrol at home.  She has been referred to outpatient neurology for  further evaluation/treatment of MS with disease modifying agents.  Patient is otherwise felt stable for discharge.  Discharge Diagnoses:  Principal Problem:   Optic neuritis, left Active Problems:   Multiple sclerosis with acute neurologic event [facility])    Discharge Instructions  Discharge Instructions    Ambulatory referral to Neurology   Complete by: As directed    Post discharge follow up for new diagnosis of MS.  She was evaluated by neurohospitalist during her admission. She will need to see Dr. Epimenio Foot in the next 2 weeks.   Diet - low sodium heart healthy   Complete by: As directed    Increase activity slowly   Complete by: As directed      Allergies as of 08/03/2019   No Known Allergies     Medication List    TAKE these medications   acetaminophen 500 MG tablet Commonly known as: TYLENOL Take 500-1,000 mg by mouth every 6 (six) hours as needed for mild pain, moderate pain or headache.   Cryselle-28 0.3-30 MG-MCG tablet Generic drug: norgestrel-ethinyl estradiol Take 1 tablet by mouth daily.   methylPREDNISolone sodium succinate 1,000 mg in sodium chloride 0.9 % 50 mL Inject 1,000 mg into the vein daily for 3 days. Start taking on: August 04, 2019      Follow-up Information    Renaye Rakers, MD Follow up on 08/30/2019.   Specialty: Family Medicine Why: 2:45 pm is your appointment Please arrive early and bring a picture ID, your current medications, insurance card Contact information: [address] [place] [address] [phone]        Asa Lente, MD Follow up.   Specialty: Neurology Why: office will call  you with an appointment Contact information: [address] [place] [address] [phone]          No Known Allergies  Consultations:  Neurology   Procedures/Studies: MR Brain W and Wo Contrast  Result Date: 08/02/2019 CLINICAL DATA:  Optic neuritis suspected on the left. Two weeks duration. EXAM: MRI HEAD AND ORBITS  WITHOUT AND WITH CONTRAST TECHNIQUE: Multiplanar, multiecho pulse sequences of the brain and surrounding structures were obtained without and with intravenous contrast. Multiplanar, multiecho pulse sequences of the orbits and surrounding structures were obtained including fat saturation techniques, before and after intravenous contrast administration. CONTRAST:  46mL GADAVIST GADOBUTROL 1 MMOL/ML IV SOLN COMPARISON:  None. FINDINGS: MRI HEAD FINDINGS Brain: No abnormality seen affecting the brainstem or cerebellum. Cerebral hemispheres show multiple foci of T2 and FLAIR signal affecting the deep and subcortical white matter with a pattern typical of multiple sclerosis. There are approximately 15 lesions within each hemisphere. 5 small white matter lesions in the right frontal lobe show contrast enhancement. Left frontal subcortical white matter lesion shows contrast enhancement. No evidence of mass lesion, ischemic infarction, hemorrhage, hydrocephalus or extra-axial collection. Vascular: Major vessels at the base of the brain show flow. Skull and upper cervical spine: Negative Other: None MRI ORBITS FINDINGS Orbits: Globes are normal. Optic neuritis on the left involving a segment of the nerve approximately 1.2 cm in length with increased T2 signal and contrast enhancement, also consistent with multiple sclerosis involvement. Right optic nerve appears normal. Visualized sinuses: Paranasal sinuses are clear Soft tissues: No soft tissue lesion in the area. Limited intracranial: See results of brain MRI. IMPRESSION: Acute demyelinating disease/multiple sclerosis presentation. Multiple T2 and FLAIR bright lesions scattered throughout the cerebral hemispheric white matter with a pattern and distribution typical of multiple sclerosis. Approximately 15 lesions in each hemisphere. Scattered lesions show contrast enhancement, further evidence of active nature. Acute optic neuritis on the left affecting a 12 mm segment of the  nerve, pre chiasmatic. Electronically Signed   By: Paulina Fusi M.D.   On: 08/02/2019 14:36   MR CERVICAL SPINE W WO CONTRAST  Result Date: 08/03/2019 CLINICAL DATA:  Myelin disease EXAM: MRI CERVICAL AND THORACIC SPINE WITH AND WITHOUT CONTRAST TECHNIQUE: Multiplanar and multiecho pulse sequences of the cervical spine, to include the craniocervical junction and cervicothoracic junction, and the thoracic spine, were obtained with and without intravenous contrast. CONTRAST:  9 mL Gadavist COMPARISON:  None. FINDINGS: MRI CERVICAL SPINE FINDINGS Alignment: Physiologic. Vertebrae: No fracture, evidence of discitis, or bone lesion. Cord: Large white matter lesion within the central spinal cord at the C2-3 levels. Small lesion at the C6 level. Posterior Fossa, vertebral arteries, paraspinal tissues: Negative Disc levels: C1-C2: Unremarkable. C2-C3: Normal disc space and facet joints. There is no spinal canal stenosis. No neural foraminal stenosis. C3-C4: Normal disc space and facet joints. There is no spinal canal stenosis. No neural foraminal stenosis. C4-C5: Normal disc space and facet joints. There is no spinal canal stenosis. No neural foraminal stenosis. C5-C6: Normal disc space and facet joints. There is no spinal canal stenosis. No neural foraminal stenosis. C6-C7: Normal disc space and facet joints. There is no spinal canal stenosis. No neural foraminal stenosis. C7-T1: Normal disc space and facet joints. There is no spinal canal stenosis. No neural foraminal stenosis. MRI THORACIC SPINE FINDINGS Alignment:  Physiologic. Vertebrae: No fracture, evidence of discitis, or bone lesion. Cord:  Lesion within the dorsal spinal cord at T6 and T11 levels. Paraspinal and other soft tissues: Negative  Disc levels: No disc herniation or stenosis. IMPRESSION: 1. Multifocal white matter lesions within the cervical and thoracic spinal cord, in keeping with demyelinating disease. 2. No contrast-enhancing lesions to indicate  active demyelination Electronically Signed   By: Ulyses Jarred M.D.   On: 08/03/2019 02:28   MR THORACIC SPINE W WO CONTRAST  Result Date: 08/03/2019 : CLINICAL DATA: Demyelinating disease EXAM: MRI CERVICAL AND THORACIC SPINE WITH AND WITHOUT CONTRAST TECHNIQUE: Multiplanar and multiecho pulse sequences of the cervical spine, to include the craniocervical junction and cervicothoracic junction, and the thoracic spine, were obtained with and without intravenous contrast. CONTRAST:   9 mL Gadavist COMPARISON:   None. FINDINGS: MRI CERVICAL SPINE FINDINGS Alignment: Physiologic. Vertebrae: No fracture, evidence of discitis, or bone lesion. Cord: Large white matter lesion within the central spinal cord at the C2-3 levels. Small lesion at the C6 level. Posterior Fossa, vertebral arteries, paraspinal tissues: Negative Disc levels: C1-C2: Unremarkable. C2-C3: Normal disc space and facet joints. There is no spinal canal stenosis. No neural foraminal stenosis. C3-C4: Normal disc space and facet joints. There is no spinal canal stenosis. No neural foraminal stenosis. C4-C5: Normal disc space and facet joints. There is no spinal canal stenosis. No neural foraminal stenosis. C5-C6: Normal disc space and facet joints. There is no spinal canal stenosis. No neural foraminal stenosis. C6-C7: Normal disc space and facet joints. There is no spinal canal stenosis. No neural foraminal stenosis. C7-T1: Normal disc space and facet joints. There is no spinal canal stenosis. No neural foraminal stenosis. MRI THORACIC SPINE FINDINGS Alignment:   Physiologic. Vertebrae: No fracture, evidence of discitis, or bone lesion. Cord:   Lesion within the dorsal spinal cord at T6 and T11 levels. Paraspinal and other soft tissues: Negative Disc levels: No disc herniation or stenosis. IMPRESSION: 1. Multifocal white matter lesions within the cervical and thoracic spinal cord, in keeping with demyelinating disease. 2. No contrast-enhancing lesions to  indicate active demyelination Electronically Signed   By: Ulyses Jarred M.D.   On: 08/03/2019 02:32   DG Chest Portable 1 View  Result Date: 08/02/2019 CLINICAL DATA:  Blurry vision MS EXAM: PORTABLE CHEST 1 VIEW COMPARISON:  None. FINDINGS: The heart size and mediastinal contours are within normal limits. Both lungs are clear. The visualized skeletal structures are unremarkable. IMPRESSION: No active disease. Electronically Signed   By: Donavan Foil M.D.   On: 08/02/2019 15:16   MR ORBITS W WO CONTRAST  Result Date: 08/02/2019 CLINICAL DATA:  Optic neuritis suspected on the left. Two weeks duration. EXAM: MRI HEAD AND ORBITS WITHOUT AND WITH CONTRAST TECHNIQUE: Multiplanar, multiecho pulse sequences of the brain and surrounding structures were obtained without and with intravenous contrast. Multiplanar, multiecho pulse sequences of the orbits and surrounding structures were obtained including fat saturation techniques, before and after intravenous contrast administration. CONTRAST:  73mL GADAVIST GADOBUTROL 1 MMOL/ML IV SOLN COMPARISON:  None. FINDINGS: MRI HEAD FINDINGS Brain: No abnormality seen affecting the brainstem or cerebellum. Cerebral hemispheres show multiple foci of T2 and FLAIR signal affecting the deep and subcortical white matter with a pattern typical of multiple sclerosis. There are approximately 15 lesions within each hemisphere. 5 small white matter lesions in the right frontal lobe show contrast enhancement. Left frontal subcortical white matter lesion shows contrast enhancement. No evidence of mass lesion, ischemic infarction, hemorrhage, hydrocephalus or extra-axial collection. Vascular: Major vessels at the base of the brain show flow. Skull and upper cervical spine: Negative Other: None MRI ORBITS FINDINGS Orbits: Globes are  normal. Optic neuritis on the left involving a segment of the nerve approximately 1.2 cm in length with increased T2 signal and contrast enhancement, also  consistent with multiple sclerosis involvement. Right optic nerve appears normal. Visualized sinuses: Paranasal sinuses are clear Soft tissues: No soft tissue lesion in the area. Limited intracranial: See results of brain MRI. IMPRESSION: Acute demyelinating disease/multiple sclerosis presentation. Multiple T2 and FLAIR bright lesions scattered throughout the cerebral hemispheric white matter with a pattern and distribution typical of multiple sclerosis. Approximately 15 lesions in each hemisphere. Scattered lesions show contrast enhancement, further evidence of active nature. Acute optic neuritis on the left affecting a 12 mm segment of the nerve, pre chiasmatic. Electronically Signed   By: Paulina Fusi M.D.   On: 08/02/2019 14:36       Subjective: Feeling better, blurred vision improving, no pain in eye  Discharge Exam: Vitals:   08/03/19 0331 08/03/19 0857 08/03/19 1202 08/03/19 1559  BP: 112/62 123/71 118/79 125/79  Pulse: 68 65 67 73  Resp: 17 16 20 20   Temp: 98.2 F (36.8 C) 98.4 F (36.9 C) 98.2 F (36.8 C) 97.9 F (36.6 C)  TempSrc: Oral Oral Oral Oral  SpO2: 100% 100% 100% 99%  Weight:      Height:        General: Pt is alert, awake, not in acute distress Cardiovascular: RRR, S1/S2 +, no rubs, no gallops Respiratory: CTA bilaterally, no wheezing, no rhonchi Abdominal: Soft, NT, ND, bowel sounds + Extremities: no edema, no cyanosis    The results of significant diagnostics from this hospitalization (including imaging, microbiology, ancillary and laboratory) are listed below for reference.     Microbiology: Recent Results (from the past 240 hour(s))  SARS CORONAVIRUS 2 (TAT 6-24 HRS) Nasopharyngeal Nasopharyngeal Swab     Status: None   Collection Time: 08/02/19  3:18 PM   Specimen: Nasopharyngeal Swab  Result Value Ref Range Status   SARS Coronavirus 2 NEGATIVE NEGATIVE Final    Comment: (NOTE) SARS-CoV-2 target nucleic acids are NOT DETECTED. The SARS-CoV-2 RNA  is generally detectable in upper and lower respiratory specimens during the acute phase of infection. Negative results do not preclude SARS-CoV-2 infection, do not rule out co-infections with other pathogens, and should not be used as the sole basis for treatment or other patient management decisions. Negative results must be combined with clinical observations, patient history, and epidemiological information. The expected result is Negative. Fact Sheet for Patients: 08/04/19 Fact Sheet for Healthcare Providers: HairSlick.no This test is not yet approved or cleared by the [place] FDA and  has been authorized for detection and/or diagnosis of SARS-CoV-2 by FDA under an Emergency Use Authorization (EUA). This EUA will remain  in effect (meaning this test can be used) for the duration of the COVID-19 declaration under Section 56 4(b)(1) of the Act, 21 U.S.C. section 360bbb-3(b)(1), unless the authorization is terminated or revoked sooner. Performed at [facility], [address], [place], [place]      Labs: BNP (last 3 results) No results for input(s): BNP in the last 8760 hours. Basic Metabolic Panel: Recent Labs  Lab 08/02/19 1237 08/03/19 0353  NA 136 138  K 4.0 4.2  CL 105 106  CO2 22 22  GLUCOSE 90 122*  BUN 10 11  CREATININE 0.82 0.90  CALCIUM 9.0 9.8   Liver Function Tests: Recent Labs  Lab 08/03/19 0353  AST 20  ALT 17  ALKPHOS 51  BILITOT 0.5  PROT 7.7  ALBUMIN 3.9   No results for input(s): LIPASE, AMYLASE in the last 168 hours. No results for input(s): AMMONIA in the last 168 hours. CBC: Recent Labs  Lab 08/02/19 1237 08/02/19 1632  WBC 4.3 4.4  NEUTROABS 1.6*  --   HGB 12.0 11.8*  HCT 37.8 36.9  MCV 92.0 90.7  PLT 209 225   Cardiac Enzymes: No results for input(s): CKTOTAL, CKMB, CKMBINDEX, TROPONINI in the last 168 hours. BNP: Invalid input(s):  POCBNP CBG: No results for input(s): GLUCAP in the last 168 hours. D-Dimer No results for input(s): DDIMER in the last 72 hours. Hgb A1c No results for input(s): HGBA1C in the last 72 hours. Lipid Profile No results for input(s): CHOL, HDL, LDLCALC, TRIG, CHOLHDL, LDLDIRECT in the last 72 hours. Thyroid function studies No results for input(s): TSH, T4TOTAL, T3FREE, THYROIDAB in the last 72 hours.  Invalid input(s): FREET3 Anemia work up No results for input(s): VITAMINB12, FOLATE, FERRITIN, TIBC, IRON, RETICCTPCT in the last 72 hours. Urinalysis No results found for: COLORURINE, APPEARANCEUR, LABSPEC, PHURINE, GLUCOSEU, HGBUR, BILIRUBINUR, KETONESUR, PROTEINUR, UROBILINOGEN, NITRITE, LEUKOCYTESUR Sepsis Labs Invalid input(s): PROCALCITONIN,  WBC,  LACTICIDVEN Microbiology Recent Results (from the past 240 hour(s))  SARS CORONAVIRUS 2 (TAT 6-24 HRS) Nasopharyngeal Nasopharyngeal Swab     Status: None   Collection Time: 08/02/19  3:18 PM   Specimen: Nasopharyngeal Swab  Result Value Ref Range Status   SARS Coronavirus 2 NEGATIVE NEGATIVE Final    Comment: (NOTE) SARS-CoV-2 target nucleic acids are NOT DETECTED. The SARS-CoV-2 RNA is generally detectable in upper and lower respiratory specimens during the acute phase of infection. Negative results do not preclude SARS-CoV-2 infection, do not rule out co-infections with other pathogens, and should not be used as the sole basis for treatment or other patient management decisions. Negative results must be combined with clinical observations, patient history, and epidemiological information. The expected result is Negative. Fact Sheet for Patients: HairSlick.no Fact Sheet for Healthcare Providers: quierodirigir.com This test is not yet approved or cleared by the [place] FDA and  has been authorized for detection and/or diagnosis of SARS-CoV-2 by FDA under an Emergency Use  Authorization (EUA). This EUA will remain  in effect (meaning this test can be used) for the duration of the COVID-19 declaration under Section 56 4(b)(1) of the Act, 21 U.S.C. section 360bbb-3(b)(1), unless the authorization is terminated or revoked sooner. Performed at [facility], [address] [place], [place] [address]      Time coordinating discharge:  SIGNED:   Erick Blinks, MD  [facility] 08/03/2019, 7:00 PM   If 7PM-7AM, please contact night-coverage [email or web address]

## 2019-08-17 ENCOUNTER — Other Ambulatory Visit: Payer: Self-pay

## 2019-08-17 ENCOUNTER — Ambulatory Visit (INDEPENDENT_AMBULATORY_CARE_PROVIDER_SITE_OTHER): Payer: Managed Care, Other (non HMO) | Admitting: Neurology

## 2019-08-17 ENCOUNTER — Encounter: Payer: Self-pay | Admitting: Neurology

## 2019-08-17 VITALS — BP 106/69 | HR 83 | Temp 97.6°F | Ht 66.0 in | Wt 198.6 lb

## 2019-08-17 DIAGNOSIS — R200 Anesthesia of skin: Secondary | ICD-10-CM

## 2019-08-17 DIAGNOSIS — Z79899 Other long term (current) drug therapy: Secondary | ICD-10-CM

## 2019-08-17 DIAGNOSIS — H469 Unspecified optic neuritis: Secondary | ICD-10-CM

## 2019-08-17 DIAGNOSIS — R3915 Urgency of urination: Secondary | ICD-10-CM

## 2019-08-17 DIAGNOSIS — R5383 Other fatigue: Secondary | ICD-10-CM

## 2019-08-17 DIAGNOSIS — G35 Multiple sclerosis: Secondary | ICD-10-CM

## 2019-08-17 MED ORDER — SOLIFENACIN SUCCINATE 5 MG PO TABS: 5.0000 mg | ORAL_TABLET | Freq: Every day | ORAL | 5 refills | Status: DC

## 2019-08-17 NOTE — Progress Notes (Addendum)
[facility]  PATIENT: Britlee Skolnik DOB: 1991/01/18  REFERRING DOCTOR OR PCP: Dr. Roderic Palau SOURCE: Patient, recent hospitalization notes, imaging and lab reports, MRI images personally reviewed.  _________________________________   HISTORICAL  CHIEF COMPLAINT:  Chief Complaint  Patient presents with  . New Patient (Initial Visit)    RM 12, alone. Internal referral from Kathie Dike, MD for MS/optic neuritis. Was at [facility] 08/02/19-08/03/19. Received 2 days IV steroids in the hospital and then 3 days via home health ([facility]) at home. Vision in left eye worse than right but has improved since receiving IV steroids. She wears contacts mostly/glasses. Reports tingling/numbness in both feet. This started in right foot and went to left. Denies any weakness.     HISTORY OF PRESENT ILLNESS:  I had the pleasure seeing your patient, Shanequa Whitenight, at the [facility] at [facility] for neurologic consultation regarding her recent optic neuritis and MRI of the brain and spinal cord consistent with relapsing remitting multiple sclerosis  She is a 29 year old woman who had left-sided visual loss for 1 to 2 weeks that started with pain upon eye movements.  She saw optometry and then Dr. Manuella Ghazi (ophthalmology) 08/01/2019 and was referred to the [facility] emergency room.  In the emergency room an MRI of the brain was consistent with optic neuritis and multiple sclerosis and she was admitted.  She received IV Solu-Medrol for total of 5 days. She was discharged after a couple days and her last several doses were performed at home by home health.  She also reports tingling in her feet and hands.  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  The hand numbness seemed to develop over a week.   She had the onset of bilateral foot numbness that started on the right foot and then over a few days also involved the left foot.    This just  appeared the one day.  She denies any weakness in her limbs.  Gait is mildly off balanced at times.   She can walk a mile as fast as last year.  She notes urinary frequency and urgency but no incontinence.  This came on about the same time as her leg numbness in 2020.    Since the steroids, she notes her vision is better and almost at baseline.     She continues to note numbness and tingling in both hands, slightly worse on her right, and in both feet.    She has some fatigue, worse over the past year.   She is sleeping worse.  She notes some depression and anxiety, not severe.   She has had social anxiety which worsened during the pandemic.      She denies major cognitive issues but srtumbles over her words more.     MRI images were personally reviewed: MRI of the brain and orbits from August 02, 2019:  There is an enhancing lesion within the left optic nerve consistent with optic neuritis.  In the brain, there are multiple T2/FLAIR hyperintense foci in the periventricular, juxtacortical and deep white matter.  5 of these enhance after contrast is administered.  The brainstem and cerebellum appear normal.  MRI of the cervical and thoracic spine August 03, 2019: There are nonenhancing foci consistent with chronic demyelination adjacent to C2-C3 (posterior very slightly left greater than right), adjacent to C3 (ventral midline), adjacent to C6 (central), T6 (central) and T11-T12 (posterior).  There were no significant  degenerative changes.  Pertinent lab work 08/02/2019: ESR, CRP, anti-Jo 1 were normal or negative.  The anti-double-stranded DNA antibodies were slightly positive.  Borderline anemia (he hemoglobin 11.8), normal liver function tests, normal lymphocyte count  REVIEW OF SYSTEMS: Constitutional: No fevers, chills, sweats, or change in appetite Eyes: No visual changes, double vision, eye pain Ear, nose and throat: No hearing loss, ear pain, nasal congestion, sore throat Cardiovascular: No  chest pain, palpitations Respiratory: No shortness of breath at rest or with exertion.   No wheezes GastrointestinaI: No nausea, vomiting, diarrhea, abdominal pain, fecal incontinence Genitourinary: No dysuria, urinary retention or frequency.  No nocturia. Musculoskeletal: No neck pain, back pain Integumentary: No rash, pruritus, skin lesions Neurological: as above Psychiatric: No depression at this time.  No anxiety Endocrine: No palpitations, diaphoresis, change in appetite, change in weigh or increased thirst Hematologic/Lymphatic: No anemia, purpura, petechiae. Allergic/Immunologic: No itchy/runny eyes, nasal congestion, recent allergic reactions, rashes  ALLERGIES: No Known Allergies  HOME MEDICATIONS:  Current Outpatient Medications:  .  acetaminophen (TYLENOL) 500 MG tablet, Take 500-1,000 mg by mouth every 6 (six) hours as needed for mild pain, moderate pain or headache. , Disp: , Rfl:  .  norgestrel-ethinyl estradiol (CRYSELLE-28) 0.3-30 MG-MCG tablet, Take 1 tablet by mouth daily., Disp: , Rfl:  .  solifenacin (VESICARE) 5 MG tablet, Take 1 tablet (5 mg total) by mouth daily., Disp: 30 tablet, Rfl: 5  PAST MEDICAL HISTORY: History reviewed. No pertinent past medical history.  PAST SURGICAL HISTORY: Past Surgical History:  Procedure Laterality Date  . WISDOM TOOTH EXTRACTION     x2    FAMILY HISTORY: History reviewed. No pertinent family history.  SOCIAL HISTORY:  Social History   Socioeconomic History  . Marital status: Single    Spouse name: Not on file  . Number of children: 0  . Years of education: some college  . Highest education level: Not on file  Occupational History  . Occupation: [employer]  Tobacco Use  . Smoking status: Never Smoker  . Smokeless tobacco: Never Used  Substance and Sexual Activity  . Alcohol use: Not Currently  . Drug use: Yes    Types: Marijuana  . Sexual activity: Not on file  Other Topics Concern  . Not on file  Social  History Narrative   Right handed    Social Determinants of Health   Financial Resource Strain:   . Difficulty of Paying Living Expenses:   Food Insecurity:   . Worried About Charity fundraiser in the Last Year:   . Arboriculturist in the Last Year:   Transportation Needs:   . Film/video editor (Medical):   Marland Kitchen Lack of Transportation (Non-Medical):   Physical Activity:   . Days of Exercise per Week:   . Minutes of Exercise per Session:   Stress:   . Feeling of Stress :   Social Connections:   . Frequency of Communication with Friends and Family:   . Frequency of Social Gatherings with Friends and Family:   . Attends Religious Services:   . Active Member of Clubs or Organizations:   . Attends Archivist Meetings:   Marland Kitchen Marital Status:   Intimate Partner Violence:   . Fear of Current or Ex-Partner:   . Emotionally Abused:   Marland Kitchen Physically Abused:   . Sexually Abused:      PHYSICAL EXAM  Vitals:   08/17/19 0900  BP: 106/69  Pulse: 83  Temp: 97.6 F (36.4 C)  Weight: 198 lb 9.6 oz (90.1 kg)  Height: '5\' 6"'  (1.676 m)    Body mass index is 32.05 kg/m.   General: The patient is well-developed and well-nourished and in no acute distress  HEENT:  Head is Platinum/AT.  Sclera are anicteric.  Funduscopic exam shows normal optic discs and retinal vessels.  Neck: No carotid bruits are noted.  The neck is nontender.  Cardiovascular: The heart has a regular rate and rhythm with a normal S1 and S2. There were no murmurs, gallops or rubs.    Skin: Extremities are without rash or  edema.  Musculoskeletal:  Back is nontender  Neurologic Exam  Mental status: The patient is alert and oriented x 3 at the time of the examination. The patient has apparent normal recent and remote memory, with an apparently normal attention span and concentration ability.   Speech is normal.  Cranial nerves: Extraocular movements are full. Pupils show 1+ left APD.   Colors are symmetric. .   Facial symmetry is present. There is good facial sensation to soft touch bilaterally.Facial strength is normal.  Trapezius and sternocleidomastoid strength is normal. No dysarthria is noted.  The tongue is midline, and the patient has symmetric elevation of the soft palate. No obvious hearing deficits are noted.  Motor:  Muscle bulk is normal.   Tone is normal. Strength is  5 / 5 in all 4 extremities.   Sensory: Sensory testing is intact to touch, temperature and vibration.    Coordination: Cerebellar testing reveals good finger-nose-finger and heel-to-shin bilaterally.  Gait and station: Station is normal.   Gait is normal. Tandem gait is mildly wide. Romberg is negative.   Reflexes: Deep tendon reflexes are symmetric and 2 in the arms, 3 at knees and ankles   Plantar responses are flexor.    DIAGNOSTIC DATA (LABS, IMAGING, TESTING) - I reviewed patient records, labs, notes, testing and imaging myself where available.  Lab Results  Component Value Date   WBC 4.4 08/02/2019   HGB 11.8 (L) 08/02/2019   HCT 36.9 08/02/2019   MCV 90.7 08/02/2019   PLT 225 08/02/2019      Component Value Date/Time   NA 138 08/03/2019 0353   K 4.2 08/03/2019 0353   CL 106 08/03/2019 0353   CO2 22 08/03/2019 0353   GLUCOSE 122 (H) 08/03/2019 0353   BUN 11 08/03/2019 0353   CREATININE 0.90 08/03/2019 0353   CALCIUM 9.8 08/03/2019 0353   PROT 7.7 08/03/2019 0353   ALBUMIN 3.9 08/03/2019 0353   AST 20 08/03/2019 0353   ALT 17 08/03/2019 0353   ALKPHOS 51 08/03/2019 0353   BILITOT 0.5 08/03/2019 0353   GFRNONAA >60 08/03/2019 0353   GFRAA >60 08/03/2019 0353       ASSESSMENT AND PLAN  Multiple sclerosis with acute neurologic event (HCC)  Optic neuritis, left  Other fatigue  Urinary urgency  Numbness  In summary, Ms. Bezold is a 29 year old woman with recent optic neuritis.  Additionally, in the past she had at least 2 sensory exacerbations.  The combination of her clinical history,  MRI images and physical examination is consistent with clinically definite relapsing remitting MS.  She meets the McDonald criteria.  We had a long conversation about the treatment of MS.  She is presenting with above average level of aggressiveness.  We discussed several therapeutic options, including Zeposia, Mayzent, Ocrevus and Tysabri.   Therefore, Zeposia or Mayzent would be a good option for her.  I discussed with her  that we are participating in an open label study of zeposia to gain more cognitive data and she is potentially interested in that study.  I have forwarded her information to our research staff and they will be in contact with her.  I gave her a copy of the informed consent form.  If she is not interested in participating in the study after reading this, we will obtain necessary blood work to get her initiated on either Zeposia or Mayzent as soon as possible.  She has urinary urgency and I sent in a prescription for generic Vesicare.  We can adjust the dose or use a different agent depending on her response.  We also discussed symptoms that exacerbations might cause.  She will return to see me for either a study visit or in 3 months after initiating therapy.  She will call sooner if she has new or worsening neurologic symptoms.  Thank you for asking me to see Ms. Puccini.  Please let me know if I can be of further assistance with her or other patients in the future.    Erdine Hulen A. Felecia Shelling, MD, [facility] 10/22/7032, 0:35 PM Certified in Neurology, Clinical Neurophysiology, Sleep Medicine and Neuroimaging  [facility] [address] [place], [place] [address] [phone]

## 2019-08-26 ENCOUNTER — Ambulatory Visit: Payer: Managed Care, Other (non HMO) | Attending: Internal Medicine

## 2019-08-26 DIAGNOSIS — Z23 Encounter for immunization: Secondary | ICD-10-CM

## 2019-08-26 NOTE — Progress Notes (Signed)
   Covid-19 Vaccination Clinic  Name:  Carol Berger    MRN: [id number] DOB: 1990-05-24  08/26/2019  [name] was observed post Covid-19 immunization for 15 minutes without incident. She was provided with Vaccine Information Sheet and instruction to access the V-Safe system.   [name] was instructed to call 911 with any severe reactions post vaccine: Marland Kitchen Difficulty breathing  . Swelling of face and throat  . A fast heartbeat  . A bad rash all over body  . Dizziness and weakness   Immunizations Administered    Name Date Dose VIS Date Route   Pfizer COVID-19 Vaccine 08/26/2019  9:24 AM 0.3 mL 04/28/2019 Intramuscular   Manufacturer: [employer], [employer]   Lot: [id number]   NDC: [address]-3

## 2019-08-29 ENCOUNTER — Telehealth: Payer: Self-pay | Admitting: Neurology

## 2019-08-29 NOTE — Telephone Encounter (Signed)
Called pt back. She did not hear from research staff about drug study. She does not want to proceed with this. She prefers to go on Ocrevus. Advised I will send message to Dr. Epimenio Foot and call her back with next steps.

## 2019-08-29 NOTE — Telephone Encounter (Signed)
Pt called wanting to know what she needs to do in order to get started on infusion says she would like to move forward with ocreavus

## 2019-08-31 ENCOUNTER — Other Ambulatory Visit: Payer: Self-pay | Admitting: Neurology

## 2019-08-31 DIAGNOSIS — G35 Multiple sclerosis: Secondary | ICD-10-CM

## 2019-08-31 DIAGNOSIS — Z79899 Other long term (current) drug therapy: Secondary | ICD-10-CM

## 2019-08-31 NOTE — Telephone Encounter (Signed)
I have placed orders for lab work.  She can come in anytime next week to get the labs done.  When she comes in she can sign an Ocrevus form.

## 2019-09-01 NOTE — Telephone Encounter (Signed)
Called pt back. She will come by next Wednesday morning to get labs done/sign start form. Gave office hours. Advised lab closed from 12-1pm for lunch. She verbalized understanding.

## 2019-09-06 ENCOUNTER — Other Ambulatory Visit (INDEPENDENT_AMBULATORY_CARE_PROVIDER_SITE_OTHER): Payer: Self-pay

## 2019-09-06 ENCOUNTER — Telehealth: Payer: Self-pay | Admitting: *Deleted

## 2019-09-06 DIAGNOSIS — Z0289 Encounter for other administrative examinations: Secondary | ICD-10-CM

## 2019-09-06 NOTE — Addendum Note (Signed)
Addended by: Luna Stands D on: 09/06/2019 09:03 AM   Modules accepted: Orders

## 2019-09-06 NOTE — Telephone Encounter (Signed)
Placed JCV lab in quest lock box for routine lab pick up. Results pending. 

## 2019-09-09 LAB — QUANTIFERON-TB GOLD PLUS
QuantiFERON Mitogen Value: >10 IU/mL
QuantiFERON Nil Value: 0.03 IU/mL
QuantiFERON TB1 Ag Value: 0.02 IU/mL
QuantiFERON TB2 Ag Value: 0.03 IU/mL
QuantiFERON-TB Gold Plus: NEGATIVE

## 2019-09-09 LAB — HEPATITIS B CORE ANTIBODY, TOTAL: Hep B Core Total Ab: NEGATIVE

## 2019-09-09 LAB — HIV ANTIBODY (ROUTINE TESTING W REFLEX): HIV Screen 4th Generation wRfx: NONREACTIVE

## 2019-09-09 LAB — VARICELLA ZOSTER ANTIBODY, IGG: Varicella zoster IgG: 2309 index (ref >165)

## 2019-09-09 LAB — HEPATITIS B SURFACE ANTIBODY,QUALITATIVE: Hep B Surface Ab, Qual: REACTIVE

## 2019-09-09 LAB — HEPATITIS B SURFACE ANTIGEN: Hepatitis B Surface Ag: NEGATIVE

## 2019-09-11 ENCOUNTER — Telehealth: Payer: Self-pay | Admitting: *Deleted

## 2019-09-11 NOTE — Telephone Encounter (Signed)
Called, LVM relaying results per Dr. Epimenio Foot note. Advised we will process Ocrevus start form and work in getting it approved via insurnace. Once approved, the infusion suite will call her to get scheduled.

## 2019-09-11 NOTE — Telephone Encounter (Signed)
Faxed complete/signed Ocrevus start form to [employer] at [phone]. Received fax confirmation. Gave completed start form to [facility] for them to process. Included office visit notes,signed order for intrafusion, labs, MRI. Sent copy of start form to be scanned to epic.   

## 2019-09-11 NOTE — Telephone Encounter (Signed)
-----   Message from Asa Lente, MD sent at 09/11/2019  3:25 PM EDT ----- Labs are fine.  If not already done so, we can send in the [place] form

## 2019-09-19 NOTE — Telephone Encounter (Signed)
JCV ab drawn on 09/06/2019 indeterminate, index: 0.20. Inhibition assay: negative.

## 2019-09-20 ENCOUNTER — Ambulatory Visit: Payer: Managed Care, Other (non HMO) | Attending: Internal Medicine

## 2019-09-20 ENCOUNTER — Ambulatory Visit: Payer: Managed Care, Other (non HMO)

## 2019-09-20 DIAGNOSIS — Z23 Encounter for immunization: Secondary | ICD-10-CM

## 2019-09-20 NOTE — Progress Notes (Signed)
   Covid-19 Vaccination Clinic  Name:  Luwanna Brossman    MRN: [id number] DOB: 1991/05/09  09/20/2019  Ms. Mcquade was observed post Covid-19 immunization for 15 minutes without incident. She was provided with Vaccine Information Sheet and instruction to access the V-Safe system.   Ms. Diles was instructed to call 911 with any severe reactions post vaccine: Marland Kitchen Difficulty breathing  . Swelling of face and throat  . A fast heartbeat  . A bad rash all over body  . Dizziness and weakness   Immunizations Administered    Name Date Dose VIS Date Route   Pfizer COVID-19 Vaccine 09/20/2019  4:53 PM 0.3 mL 07/12/2018 Intramuscular   Manufacturer: [employer], [employer]   Lot: [id number]   NDC: [address]-2

## 2020-03-27 ENCOUNTER — Encounter: Payer: Self-pay | Admitting: *Deleted

## 2020-03-27 ENCOUNTER — Telehealth: Payer: Self-pay | Admitting: Neurology

## 2020-03-27 NOTE — Telephone Encounter (Signed)
I returned the call to the patient. She was seen once on 08/17/19 and is due for a follow up. She would like to discuss migraines, back pain and hand cramps at her next visit. She has been scheduled on 04/29/20. She was offered several sooner times but could not come due to her work schedule.  States she will be having her second round of Ocrevus in January.  She needs a brief statement on our letterhead, signed by Dr. Epimenio Foot, that she is under our care for MS.   She may need FMLA paperwork at a later time but this will help her for now.    Dr. Epimenio Foot is out of the office until 04/01/20. She will come our office that afternoon to pick it up.

## 2020-03-27 NOTE — Telephone Encounter (Signed)
Pt. States she needs a note for her HR dept. stating she has MS. She is currently having issues with her back, hands are cramping up & she's having migraines. She states if she is not able to answer, may leave vm.

## 2020-04-01 NOTE — Telephone Encounter (Signed)
Letter placed up front for pick up

## 2020-04-29 ENCOUNTER — Encounter: Payer: Self-pay | Admitting: Neurology

## 2020-04-29 ENCOUNTER — Ambulatory Visit (INDEPENDENT_AMBULATORY_CARE_PROVIDER_SITE_OTHER): Payer: Managed Care, Other (non HMO) | Admitting: Neurology

## 2020-04-29 ENCOUNTER — Other Ambulatory Visit: Payer: Self-pay

## 2020-04-29 VITALS — BP 119/78 | HR 77 | Ht 66.0 in | Wt 197.0 lb

## 2020-04-29 DIAGNOSIS — H469 Unspecified optic neuritis: Secondary | ICD-10-CM | POA: Diagnosis not present

## 2020-04-29 DIAGNOSIS — Z79899 Other long term (current) drug therapy: Secondary | ICD-10-CM

## 2020-04-29 DIAGNOSIS — R3915 Urgency of urination: Secondary | ICD-10-CM | POA: Diagnosis not present

## 2020-04-29 DIAGNOSIS — G35 Multiple sclerosis: Secondary | ICD-10-CM

## 2020-04-29 DIAGNOSIS — F418 Other specified anxiety disorders: Secondary | ICD-10-CM

## 2020-04-29 DIAGNOSIS — R5383 Other fatigue: Secondary | ICD-10-CM

## 2020-04-29 MED ORDER — OXYBUTYNIN CHLORIDE 5 MG PO TABS: 5.0000 mg | ORAL_TABLET | Freq: Two times a day (BID) | ORAL | 4 refills | Status: DC

## 2020-04-29 MED ORDER — SERTRALINE HCL 50 MG PO TABS: 50.0000 mg | ORAL_TABLET | Freq: Every day | ORAL | 4 refills | Status: DC

## 2020-04-29 NOTE — Progress Notes (Signed)
[facility]  PATIENT: Carol Berger DOB: 08/03/1990  REFERRING DOCTOR OR PCP: Dr. Roderic Palau SOURCE: Patient, recent hospitalization notes, imaging and lab reports, MRI images personally reviewed.  _________________________________   HISTORICAL  CHIEF COMPLAINT:  Chief Complaint  Patient presents with  . Follow-up    RM 12, alone. Last seen 08/17/19. On Ocrevus for MS. Last infusion: 11/09/2019, next: 06/06/20. Receives at [facility] with intrafusion. Reports body aches all over that is intermittent. This has worsened in the last several weeks. Fatigue has worsened. Works 2-1030pm. Does not go to bed until around 3am. Later if she does not have to work the next day. She is is having more mood swings/crying a lot. Willing to try a medication for this.      HISTORY OF PRESENT ILLNESS:   Carol Berger is a 29 y.o. woman with relapsing remitting multiple sclerosis  UPDATE 04/29/2020: She started Ocrevus (June and July 2021).  She tolerated the infusions well.   She denies any new numbness or weakness.  However, she has more myalgias the last few months in her back, neck and legs.  She has had more migraine headaches.     Gait is fine.  Balance is reduced.   No falls.    She notes a sharp pain in her neck that will shoot sdown the back at times.  She could walk a mile without stopping but feels she did better a couple years ago.   She denies weakness.   She has some numbness in the right leg.  She has urinary urgency, and frequency, better than earlier this year.  She is noting some mood swings and has depression and anxiety.  She has had some crying spells.    She notes trouble coming up with the right words.   She mixes words up at times.   She sleeps poorly some nights.     She had her Covid 19 vaccination April/May 2021 before starting Ocrevus  MS HISTORY: In early 2021, she had left-sided visual loss for 1 to 2 weeks that started with pain upon eye movements.  She saw  optometry and then Dr. Manuella Ghazi (ophthalmology) 08/01/2019 and was referred to the [facility] emergency room.  In the emergency room an MRI of the brain was consistent with optic neuritis and multiple sclerosis and she was admitted.  She received IV Solu-Medrol for total of 5 days.  Vision improved.   She also reported tingling in her feet and hands.  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  The hand numbness seemed to develop over a week.   She had the onset of bilateral foot numbness that started on the right foot and then over a few days also involved the left foot.    This just appeared the one day.   She notes urinary frequency and urgency but no incontinence.  This came on about the same time as her leg numbness in 2020.     She also had MRI cervical spine showing lesions c/w MS.   She was started on Ocrevus 11/09/2019.      IMAGING MRI of the brain and orbits from August 02, 2019:  There is an enhancing lesion within the left optic nerve consistent with optic neuritis.  In the brain, there are multiple T2/FLAIR hyperintense foci in the periventricular, juxtacortical and deep white matter.  5 of these enhance after contrast is administered.  The brainstem and cerebellum appear  normal.  MRI of the cervical and thoracic spine August 03, 2019: There are nonenhancing foci consistent with chronic demyelination adjacent to C2-C3 (posterior very slightly left greater than right), adjacent to C3 (ventral midline), adjacent to C6 (central), T6 (central) and T11-T12 (posterior).  There were no significant degenerative changes.  LABS Pertinent lab work 08/02/2019: ESR, CRP, anti-Jo 1 were normal or negative.  The anti-double-stranded DNA antibodies were slightly positive.  Borderline anemia (he hemoglobin 11.8), normal liver function tests, normal lymphocyte count  REVIEW OF SYSTEMS: Constitutional: No fevers, chills, sweats, or change in appetite Eyes: No visual  changes, double vision, eye pain Ear, nose and throat: No hearing loss, ear pain, nasal congestion, sore throat Cardiovascular: No chest pain, palpitations Respiratory: No shortness of breath at rest or with exertion.   No wheezes GastrointestinaI: No nausea, vomiting, diarrhea, abdominal pain, fecal incontinence Genitourinary: No dysuria, urinary retention or frequency.  No nocturia. Musculoskeletal: No neck pain, back pain Integumentary: No rash, pruritus, skin lesions Neurological: as above Psychiatric: No depression at this time.  No anxiety Endocrine: No palpitations, diaphoresis, change in appetite, change in weigh or increased thirst Hematologic/Lymphatic: No anemia, purpura, petechiae. Allergic/Immunologic: No itchy/runny eyes, nasal congestion, recent allergic reactions, rashes  ALLERGIES: No Known Allergies  HOME MEDICATIONS:  Current Outpatient Medications:  .  acetaminophen (TYLENOL) 500 MG tablet, Take 500-1,000 mg by mouth every 6 (six) hours as needed for mild pain, moderate pain or headache. , Disp: , Rfl:  .  norgestrel-ethinyl estradiol (CRYSELLE-28) 0.3-30 MG-MCG tablet, Take 1 tablet by mouth daily., Disp: , Rfl:  .  ocrelizumab (OCREVUS) 300 MG/10ML injection, Inject into the vein once., Disp: , Rfl:  .  oxybutynin (DITROPAN) 5 MG tablet, Take 1 tablet (5 mg total) by mouth 2 (two) times daily. For bladder, Disp: 180 tablet, Rfl: 4 .  sertraline (ZOLOFT) 50 MG tablet, Take 1 tablet (50 mg total) by mouth daily., Disp: 90 tablet, Rfl: 4  PAST MEDICAL HISTORY: No past medical history on file.  PAST SURGICAL HISTORY: Past Surgical History:  Procedure Laterality Date  . WISDOM TOOTH EXTRACTION     x2    FAMILY HISTORY: No family history on file.  SOCIAL HISTORY:  Social History   Socioeconomic History  . Marital status: Single    Spouse name: Not on file  . Number of children: 0  . Years of education: some college  . Highest education level: Not on  file  Occupational History  . Occupation: [employer]  Tobacco Use  . Smoking status: Never Smoker  . Smokeless tobacco: Never Used  Vaping Use  . Vaping Use: Never used  Substance and Sexual Activity  . Alcohol use: Not Currently  . Drug use: Yes    Types: Marijuana  . Sexual activity: Not on file  Other Topics Concern  . Not on file  Social History Narrative   Right handed    Social Determinants of Health   Financial Resource Strain: Not on file  Food Insecurity: Not on file  Transportation Needs: Not on file  Physical Activity: Not on file  Stress: Not on file  Social Connections: Not on file  Intimate Partner Violence: Not on file     PHYSICAL EXAM  Vitals:   04/29/20 1345  BP: 119/78  Pulse: 77  SpO2: 98%  Weight: 197 lb (89.4 kg)  Height: '5\' 6"'  (1.676 m)    Body mass index is 31.8 kg/m.   General: The patient is well-developed and well-nourished and  in no acute distress.  Neck tenderness  HEENT:  Head is Barney/AT.   Skin: Extremities are without rash or  edema.  Neurologic Exam  Mental status: The patient is alert and oriented x 3 at the time of the examination. The patient has apparent normal recent and remote memory, with an apparently normal attention span and concentration ability.   Speech is normal.  Cranial nerves: Extraocular movements are full. Pupils now symmetric, no APD.   Colors are symmetric. .  Facial symmetry is present. There is good facial sensation to soft touch bilaterally.Facial strength is normal.  Trapezius and sternocleidomastoid strength is normal. No dysarthria is noted.  The tongue is midline, and the patient has symmetric elevation of the soft palate. No obvious hearing deficits are noted.  Motor:  Muscle bulk is normal.   Tone is normal. Strength is  5 / 5 in all 4 extremities.   Sensory: Sensory testing is intact to touch, temperature and vibration.    Coordination: Cerebellar testing reveals good finger-nose-finger and  heel-to-shin bilaterally.  Gait and station: Station is normal.   Gait is normal. Tandem gait is mildly wide. Romberg is negative.   Reflexes: Deep tendon reflexes are symmetric and 2 in the arms, 3 at knees and ankles      DIAGNOSTIC DATA (LABS, IMAGING, TESTING) - I reviewed patient records, labs, notes, testing and imaging myself where available.  Lab Results  Component Value Date   WBC 4.4 08/02/2019   HGB 11.8 (L) 08/02/2019   HCT 36.9 08/02/2019   MCV 90.7 08/02/2019   PLT 225 08/02/2019      Component Value Date/Time   NA 138 08/03/2019 0353   K 4.2 08/03/2019 0353   CL 106 08/03/2019 0353   CO2 22 08/03/2019 0353   GLUCOSE 122 (H) 08/03/2019 0353   BUN 11 08/03/2019 0353   CREATININE 0.90 08/03/2019 0353   CALCIUM 9.8 08/03/2019 0353   PROT 7.7 08/03/2019 0353   ALBUMIN 3.9 08/03/2019 0353   AST 20 08/03/2019 0353   ALT 17 08/03/2019 0353   ALKPHOS 51 08/03/2019 0353   BILITOT 0.5 08/03/2019 0353   GFRNONAA >60 08/03/2019 0353   GFRAA >60 08/03/2019 0353       ASSESSMENT AND PLAN  Multiple sclerosis with acute neurologic event (HCC)  High risk medication use  Urinary urgency  Optic neuritis, left  Other fatigue  Depression with anxiety  1.   Continue Ocrevus.   Check labs   2.   Oxybutynin for bladder  3.   Zoloft for mood 4.   Stay active and exercise as tolerated. 5.   rtc 6 months or sooner if new or worsening neuologic function   Earnest Mcgillis A. Felecia Shelling, MD, [facility] 87/19/5974, 7:18 PM Certified in Neurology, Clinical Neurophysiology, Sleep Medicine and Neuroimaging  [facility] [address], [place], [place] [address] [phone]

## 2020-04-30 ENCOUNTER — Telehealth: Payer: Self-pay | Admitting: *Deleted

## 2020-04-30 LAB — CBC WITH DIFFERENTIAL/PLATELET
Basophils Absolute: 0 10*3/uL (ref 0.0–0.2)
Basos: 1 %
EOS (ABSOLUTE): 0.1 10*3/uL (ref 0.0–0.4)
Eos: 3 %
Hematocrit: 38 % (ref 34.0–46.6)
Hemoglobin: 12.5 g/dL (ref 11.1–15.9)
Immature Grans (Abs): 0 10*3/uL (ref 0.0–0.1)
Immature Granulocytes: 0 %
Lymphocytes Absolute: 1.3 10*3/uL (ref 0.7–3.1)
Lymphs: 36 %
MCH: 29.7 pg (ref 26.6–33.0)
MCHC: 32.9 g/dL (ref 31.5–35.7)
MCV: 90 fL (ref 79–97)
Monocytes Absolute: 0.4 10*3/uL (ref 0.1–0.9)
Monocytes: 13 %
Neutrophils Absolute: 1.7 10*3/uL (ref 1.4–7.0)
Neutrophils: 47 %
Platelets: 193 10*3/uL (ref 150–450)
RBC: 4.21 x10E6/uL (ref 3.77–5.28)
RDW: 12.5 % (ref 11.7–15.4)
WBC: 3.5 10*3/uL (ref 3.4–10.8)

## 2020-04-30 LAB — HEPATIC FUNCTION PANEL
ALT: 15 IU/L (ref 0–32)
AST: 18 IU/L (ref 0–40)
Albumin: 4.3 g/dL (ref 3.9–5.0)
Alkaline Phosphatase: 60 IU/L (ref 44–121)
Bilirubin Total: <0.2 mg/dL (ref 0.0–1.2)
Bilirubin, Direct: <0.1 mg/dL (ref 0.00–0.40)
Total Protein: 6.8 g/dL (ref 6.0–8.5)

## 2020-04-30 LAB — IGG, IGA, IGM
IgA/Immunoglobulin A, Serum: 145 mg/dL (ref 87–352)
IgG (Immunoglobin G), Serum: 1262 mg/dL (ref 586–1602)
IgM (Immunoglobulin M), Srm: 132 mg/dL (ref 26–217)

## 2020-04-30 NOTE — Telephone Encounter (Signed)
-----   Message from Asa Lente, MD sent at 04/30/2020  9:25 AM EST ----- Please let the patient know that the lab work is fine.

## 2020-04-30 NOTE — Telephone Encounter (Signed)
Called and LVM for pt about lab results per Dr. Epimenio Foot note. Advised pt did not have to call back unless she had further questions.

## 2020-07-24 NOTE — Progress Notes (Signed)
Chief Complaint  Patient presents with  . Follow-up    Rm 2 alone PT states her arms are getting worse, has been tingling, some numbness and cramps. Shoulder blades are sore      HISTORY OF PRESENT ILLNESS: 07/25/20 ALL:  Carol Berger is a 30 y.o. female here today for follow up for for RRMS. She continues Ocrevus infusions. Last infusion was 06/06/2020. Labs have been normal.   She was last seen 04/2020 and Dr Felecia Shelling and doing fairly well. Over the past few weeks she has had more tingling and numbness of both arms and hands. No weakness. She feels that both arms will go to sleep at night. Changing positions help. She is having throbbing pain of left elbow. She described generalized muscle cramps, mostly of upper extremities and hands, but can occur all over.   She is having headaches fairly regularly. She has 2-3 headaches per week. They are usually bilateral but can be unilateral. Usually start in the back of her head but radiate up to the eye. She describes pain as a sharp/schy pain. No obvious migraine symptoms. She usually takes Excedrin that helps a little bit. No vision changes.   Dr Felecia Shelling started sertraline 52m daily for concerns of mood swing with depression and anxiety. She feels that mood is better. Less mood swings and does not feel as depressed. She is not sleeping well. She works second shift in a lab. She usually goes to sleep around 1-2am. She wakes around 9am. Some nights she will wake up and can not go back to sleep.   He also started oxybutynin 575mBID for urinary frequency. She feels that she can not tolerate 2 per day due to feeling dehydrated and causes her to feel badly. She is drinking water every day. Uncertain how much.    HISTORY (copied from previous note)   Carol Titteringtons a [age].o. woman with relapsing remitting multiple sclerosis  UPDATE 04/29/2020: She started Ocrevus (June and July 2021).  She tolerated the infusions well.   She denies any new  numbness or weakness.  However, she has more myalgias the last few months in her back, neck and legs.  She has had more migraine headaches.     Gait is fine.  Balance is reduced.   No falls.    She notes a sharp pain in her neck that will shoot sdown the back at times.  She could walk a mile without stopping but feels she did better a couple years ago.   She denies weakness.   She has some numbness in the right leg.  She has urinary urgency, and frequency, better than earlier this year.  She is noting some mood swings and has depression and anxiety.  She has had some crying spells.    She notes trouble coming up with the right words.   She mixes words up at times.   She sleeps poorly some nights.     She had her Covid 19 vaccination April/May 2021 before starting Ocrevus  MS HISTORY: In early 2021, she had left-sided visual loss for 1 to 2 weeks that started with pain upon eye movements.  She saw optometry and then Dr. ShManuella Ghaziophthalmology) 08/01/2019 and [facility]  In the emergency room an MRI of the brain was consistent with optic neuritis and multiple sclerosis and she was admitted.  She received IV Solu-Medrol for total of 5 days.  Vision improved.  She also reported tingling in her feet and hands.  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  The hand numbness seemed to develop over a week.   She had the onset of bilateral foot numbness that started on the right foot and then over a few days also involved the left foot.    This just appeared the one day.   She notes urinary frequency and urgency but no incontinence.  This came on about the same time as her leg numbness in 2020.     She also had MRI cervical spine showing lesions c/w MS.   She was started on Ocrevus 11/09/2019.      IMAGING MRI of the brain and orbits from August 02, 2019:  There is an enhancing lesion within the left optic nerve consistent with optic  neuritis.  In the brain, there are multiple T2/FLAIR hyperintense foci in the periventricular, juxtacortical and deep white matter.  5 of these enhance after contrast is administered.  The brainstem and cerebellum appear normal.  MRI of the cervical and thoracic spine August 03, 2019: There are nonenhancing foci consistent with chronic demyelination adjacent to C2-C3 (posterior very slightly left greater than right), adjacent to C3 (ventral midline), adjacent to C6 (central), T6 (central) and T11-T12 (posterior).  There were no significant degenerative changes.  LABS Pertinent lab work 08/02/2019: ESR, CRP, anti-Jo 1 were normal or negative.  The anti-double-stranded DNA antibodies were slightly positive.  Borderline anemia (he hemoglobin 11.8), normal liver function tests, normal lymphocyte count      REVIEW OF SYSTEMS: Out of a complete 14 system review of symptoms, the patient complains only of the following symptoms, dysesthesias, muscle cramps, headaches, insomnia, fatigue and all other reviewed systems are negative.    ALLERGIES: No Known Allergies   HOME MEDICATIONS: Outpatient Medications Prior to Visit  Medication Sig Dispense Refill  . acetaminophen (TYLENOL) 500 MG tablet Take 500-1,000 mg by mouth every 6 (six) hours as needed for mild pain, moderate pain or headache.     . norgestrel-ethinyl estradiol (CRYSELLE-28) 0.3-30 MG-MCG tablet Take 1 tablet by mouth daily.    Marland Kitchen ocrelizumab (OCREVUS) 300 MG/10ML injection Inject into the vein once.    Marland Kitchen oxybutynin (DITROPAN) 5 MG tablet Take 1 tablet (5 mg total) by mouth 2 (two) times daily. For bladder 180 tablet 4  . sertraline (ZOLOFT) 50 MG tablet Take 1 tablet (50 mg total) by mouth daily. 90 tablet 4   No facility-administered medications prior to visit.     PAST MEDICAL HISTORY: History reviewed. No pertinent past medical history.   PAST SURGICAL HISTORY: Past Surgical History:  Procedure Laterality Date  . WISDOM  TOOTH EXTRACTION     x2     FAMILY HISTORY: History reviewed. No pertinent family history.   SOCIAL HISTORY: Social History   Socioeconomic History  . Marital status: Single    Spouse name: Not on file  . Number of children: 0  . Years of education: some college  . Highest education level: Not on file  Occupational History  . Occupation: [employer]  Tobacco Use  . Smoking status: Never Smoker  . Smokeless tobacco: Never Used  Vaping Use  . Vaping Use: Never used  Substance and Sexual Activity  . Alcohol use: Not Currently  . Drug use: Yes    Types: Marijuana  . Sexual activity: Not on file  Other Topics Concern  . Not on file  Social History Narrative   Right handed    Social Determinants of Health   Financial Resource Strain: Not on file  Food Insecurity: Not on file  Transportation Needs: Not on file  Physical Activity: Not on file  Stress: Not on file  Social Connections: Not on file  Intimate Partner Violence: Not on file      PHYSICAL EXAM  Vitals:   07/25/20 0817  BP: 105/69  Pulse: 78  Weight: 194 lb (88 kg)  Height: '5\' 6"'  (1.676 m)   Body mass index is 31.31 kg/m.   Generalized: Well developed, in no acute distress  Cardiology: normal rate and rhythm, no murmur auscultated  Respiratory: clear to auscultation bilaterally    Neurological examination  Mentation: Alert oriented to time, place, history taking. Follows all commands speech and language fluent Cranial nerve II-XII: Pupils were equal round reactive to light. Extraocular movements were full, visual field were full on confrontational test. Facial sensation and strength were normal. Head turning and shoulder shrug  were normal and symmetric. Motor: The motor testing reveals 5 over 5 strength of all 4 extremities. Good symmetric motor tone is noted throughout.  Sensory: Sensory testing is intact to soft touch on all 4 extremities. She reports decreased pinprick and tempeture of entire  right arm and hand. No evidence of extinction is noted.  Coordination: Cerebellar testing reveals good finger-nose-finger and heel-to-shin bilaterally.  Gait and station: Gait is normal.   Reflexes: Deep tendon reflexes are symmetric and normal bilaterally.     DIAGNOSTIC DATA (LABS, IMAGING, TESTING) - I reviewed patient records, labs, notes, testing and imaging myself where available.  Lab Results  Component Value Date   WBC 3.5 04/29/2020   HGB 12.5 04/29/2020   HCT 38.0 04/29/2020   MCV 90 04/29/2020   PLT 193 04/29/2020      Component Value Date/Time   NA 138 08/03/2019 0353   K 4.2 08/03/2019 0353   CL 106 08/03/2019 0353   CO2 22 08/03/2019 0353   GLUCOSE 122 (H) 08/03/2019 0353   BUN 11 08/03/2019 0353   CREATININE 0.90 08/03/2019 0353   CALCIUM 9.8 08/03/2019 0353   PROT 6.8 04/29/2020 1433   ALBUMIN 4.3 04/29/2020 1433   AST 18 04/29/2020 1433   ALT 15 04/29/2020 1433   ALKPHOS 60 04/29/2020 1433   BILITOT <0.2 04/29/2020 1433   GFRNONAA >60 08/03/2019 0353   GFRAA >60 08/03/2019 0353   No results found for: CHOL, HDL, LDLCALC, LDLDIRECT, TRIG, CHOLHDL No results found for: HGBA1C No results found for: VITAMINB12 No results found for: TSH  No flowsheet data found.   No flowsheet data found.   ASSESSMENT AND PLAN  30 y.o. year old female  has no past medical history on file. here with   Relapsing remitting multiple sclerosis (HCC)  High risk medication use  Urinary urgency  Depression with anxiety  Other fatigue  Numbness  Norely has concerns of worsening generalized dysesthesias, cramps, insomnia and headaches. No concerns of acute exacerbation on exam. We will add low dose gabapentin every night. She will start 149m daily. May titrate up to 3034mat night if no improvement. She will call me with any concerns or questions. She will continue sertraline and oxybutynin as prescribed. Healthy lifestyle habits encouraged. She has follow up  scheduled in June with Dr SaFelecia Shelling  No orders of the defined types were placed in this encounter.    Meds ordered this encounter  Medications  . gabapentin (  NEURONTIN) 100 MG capsule    Sig: Take 1 capsule (100 mg total) by mouth at bedtime.    Dispense:  90 capsule    Refill:  1    Order Specific Question:   Supervising Provider    Answer:   Melvenia Beam [id number]      I spent 35 minutes of face-to-face and non-face-to-face time with patient.  This included previsit chart review, lab review, study review, order entry, electronic health record documentation, patient education.    Debbora Presto, MSN, FNP-C 07/25/2020, 4:48 PM  [facility] [address], [place], [place] [address] [phone]

## 2020-07-24 NOTE — Patient Instructions (Incomplete)
Below is our plan:  We will continue sertraline and oxybutynin. I will add gabapentin 100mg  at bedtime for help with sleep, headaches and dysesthesias. I will ask to help you get in with a primary care provider in [place]. Please let me know if you have any worsening of symptoms.   Please make sure you are staying well hydrated. I recommend 50-60 ounces daily. Well balanced diet and regular exercise encouraged. Consistent sleep schedule with 6-8 hours recommended.   Please continue follow up with care team as directed.   Follow up with Dr [place] as scheduled in June.   You may receive a survey regarding today's visit. I encourage you to leave honest feed back as I do use this information to improve patient care. Thank you for seeing me today!      Multiple Sclerosis Multiple sclerosis (MS) is a disease of the brain, spinal cord, and optic nerves (central nervous system). It causes the body's disease-fighting (immune) system to destroy the protective covering (myelin sheath) around nerves in the brain. When this happens, signals (nerve impulses) going to and from the brain and spinal cord do not get sent properly or may not get sent at all. There are several types of MS:  Relapsing-remitting MS. This is the most common type. This causes sudden attacks of symptoms. After an attack, you may recover completely until the next attack, or some symptoms may remain permanently.  Secondary progressive MS. This usually develops after the onset of relapsing-remitting MS. Similar to relapsing-remitting MS, this type also causes sudden attacks of symptoms. Attacks may be less frequent, but symptoms slowly get worse (progress) over time.  Primary progressive MS. This causes symptoms that steadily progress over time. This type of MS does not cause sudden attacks of symptoms. The age of onset of MS varies, but it often develops between 7-41 years of age. MS is a lifelong (chronic) condition. There is  no cure, but treatment can help slow down the progression of the disease. What are the causes? The cause of this condition is not known. What increases the risk? You are more likely to develop this condition if:  You are a woman.  You have a relative with MS. However, the condition is not passed from parent to child (inherited).  You have a lack (deficiency) of vitamin D.  You smoke. MS is more common in the 41 than in the [place]. What are the signs or symptoms? Relapsing-remitting and secondary progressive MS cause symptoms to occur in episodes or attacks that may last weeks to months. There may be long periods between attacks in which there are almost no symptoms. Primary progressive MS causes symptoms to steadily progress after they develop. Symptoms of MS vary because of the many different ways it affects the central nervous system. The main symptoms include:  Vision problems and eye pain.  Numbness and weakness.  Inability to move your arms, hands, feet, or legs (paralysis).  Balance problems.  Shaking that you cannot control (tremors).  Muscle spasms.  Problems with thinking (cognitive changes). MS can also cause symptoms that are associated with the disease, but are not always the direct result of an MS attack. They may include:  Inability to control urination or bowel movements (incontinence).  Headaches.  Fatigue.  Inability to tolerate heat.  Emotional changes.  Depression.  Pain. How is this diagnosed? This condition is diagnosed based on:  Your symptoms.  A neurological exam. This involves checking central  nervous system function, such as nerve function, reflexes, and coordination.  MRIs of the brain and spinal cord.  Lab tests, including a lumbar puncture that tests the fluid that surrounds the brain and spinal cord (cerebrospinal fluid).  Tests to measure the electrical activity of the brain in response to  stimulation (evoked potentials). How is this treated? There is no cure for MS, but medicines can help decrease the number and frequency of attacks and help relieve nuisance symptoms. Treatment options may include:  Medicines that reduce the frequency of attacks. These medicines may be given by injection, by mouth (orally), or through an IV.  Medicines that reduce inflammation (steroids). These may provide short-term relief of symptoms.  Medicines to help control pain, depression, fatigue, or incontinence.  Nutritional counseling. Vitamin D supplements, if you have a deficiency.  Using devices to help you move around (assistive devices), such as braces, a cane, or a walker.  Physical therapy to strengthen and stretch your muscles.  Occupational therapy to help you with everyday tasks.  Alternative or complementary treatments such as exercise, massage, or acupuncture.   Follow these instructions at home:  Take over-the-counter and prescription medicines only as told by your health care provider.  Do not drive or use heavy machinery while taking prescription pain medicine.  Use assistive devices as recommended by your physical therapist or your health care provider.  Exercise as directed by your health care provider.  Eating healthy can help manage MS symptoms.  Return to your normal activities as told by your health care provider. Ask your health care provider what activities are safe for you.  Reach out for support. Share your feelings with friends, family, or a support group.  Keep all follow-up visits as told by your health care provider and therapists. This is important. Where to find more information  National Multiple Sclerosis Society: https://www.nationalmssociety.org  [employer] of Neurological Disorders and Stroke: https://johnson-smith.net/  [employer] for Complementary and Integrative Health: http://miller-hamilton.net/ Contact a health care provider  if:  You feel depressed.  You develop new pain or numbness.  You have tremors.  You have problems with sexual function. Get help right away if:  You develop paralysis.  You develop numbness.  You have problems with your bladder or bowel function.  You develop double vision.  You lose vision in one or both eyes.  You develop suicidal thoughts.  You develop severe confusion. If you ever feel like you may hurt yourself or others, or have thoughts about taking your own life, get help right away. You can go to your nearest emergency department or call:  Your local emergency services (911 in the [place]  A suicide crisis helpline, such as the National Suicide Prevention Lifeline at (916)452-9727. This is open 24 hours a day. Summary  Multiple sclerosis (MS) is a disease of the central nervous system that causes the body's immune system to destroy the protective covering (myelin sheath) around nerves in the brain.  There are 3 types of MS: relapsing-remitting, secondary progressive, and primary progressive. Relapsing-remitting and secondary progressive MS cause symptoms to occur in episodes or attacks that may last weeks to months. Primary progressive MS causes symptoms to steadily progress after they develop.  There is no cure for MS, but medicines can help decrease the number and frequency of attacks and help relieve nuisance symptoms. Treatment may also include physical or occupational therapy.  If you develop numbness, paralysis, vision problems, or other neurological symptoms, get help right away. This  information is not intended to replace advice given to you by your health care provider. Make sure you discuss any questions you have with your health care provider. Document Revised: 02/13/2020 Document Reviewed: 02/13/2020 Elsevier Patient Education  2021 [employer].

## 2020-07-25 ENCOUNTER — Encounter: Payer: Self-pay | Admitting: Family Medicine

## 2020-07-25 ENCOUNTER — Other Ambulatory Visit: Payer: Self-pay

## 2020-07-25 ENCOUNTER — Ambulatory Visit (INDEPENDENT_AMBULATORY_CARE_PROVIDER_SITE_OTHER): Payer: Managed Care, Other (non HMO) | Admitting: Family Medicine

## 2020-07-25 VITALS — BP 105/69 | HR 78 | Ht 66.0 in | Wt 194.0 lb

## 2020-07-25 DIAGNOSIS — G35 Multiple sclerosis: Secondary | ICD-10-CM

## 2020-07-25 DIAGNOSIS — Z79899 Other long term (current) drug therapy: Secondary | ICD-10-CM | POA: Diagnosis not present

## 2020-07-25 DIAGNOSIS — R200 Anesthesia of skin: Secondary | ICD-10-CM

## 2020-07-25 DIAGNOSIS — R3915 Urgency of urination: Secondary | ICD-10-CM | POA: Diagnosis not present

## 2020-07-25 DIAGNOSIS — F418 Other specified anxiety disorders: Secondary | ICD-10-CM

## 2020-07-25 DIAGNOSIS — R5383 Other fatigue: Secondary | ICD-10-CM

## 2020-07-25 MED ORDER — GABAPENTIN 100 MG PO CAPS: 100.0000 mg | ORAL_CAPSULE | Freq: Every day | ORAL | 1 refills | Status: DC

## 2020-07-25 NOTE — Progress Notes (Signed)
I have read the note, and I agree with the clinical assessment and plan.  Richard A. Sater, MD, PhD, FAAN Certified in Neurology, Clinical Neurophysiology, Sleep Medicine, Pain Medicine and Neuroimaging  [facility] [address], [address] [place], [place] [address] [phone]  

## 2020-09-10 ENCOUNTER — Telehealth: Payer: Self-pay | Admitting: Family Medicine

## 2020-09-10 NOTE — Telephone Encounter (Signed)
Tried to call and Voicemailbox not set up.

## 2020-09-10 NOTE — Telephone Encounter (Signed)
Pt states she went to her PCP and it was suggested she request the increase on her gabapentin (NEURONTIN) 100 MG capsule.  Please call pt to discuss

## 2020-09-11 NOTE — Telephone Encounter (Signed)
Pt called back,  She was not aware her VM not set up.  She will look into this, also sent text for sign up for mychart.  She will look at that as well.  She stated the gabapentin 100mg  po qhs initially helped with sleep, not so much now.  Did help her headaches.  She continues with tingling numbness, bil hands and arms,  R side more.  Does have cramping too at times. Feels like maybe getting slightly worse.  ?? Increase gabapentin. Next appt with Dr. 10-30-20.

## 2020-09-11 NOTE — Telephone Encounter (Signed)
Called pt and VM not set up.  (this is 3rd time).  Will wait to hear from her.  No email.

## 2020-09-12 MED ORDER — GABAPENTIN 100 MG PO CAPS: 300.0000 mg | ORAL_CAPSULE | Freq: Every day | ORAL | 5 refills | Status: DC

## 2020-09-12 NOTE — Telephone Encounter (Signed)
Gabapentin increase to 300mg  every night at bedtime. Script called into pharmacy.

## 2020-09-12 NOTE — Addendum Note (Signed)
Addended byNicholas Lose, Carlis Burnsworth L on: 09/12/2020 09:07 AM   Modules accepted: Orders

## 2020-10-30 ENCOUNTER — Ambulatory Visit: Payer: Managed Care, Other (non HMO) | Admitting: Neurology

## 2020-11-08 ENCOUNTER — Telehealth: Payer: Self-pay | Admitting: Family Medicine

## 2020-11-08 ENCOUNTER — Ambulatory Visit: Payer: Managed Care, Other (non HMO) | Admitting: Neurology

## 2020-11-08 NOTE — Telephone Encounter (Signed)
Pt cancelled appt due to feeling sick. Informed her to call on Monday to pay No show fee of $50. We will be glad to reschedule your appt.

## 2021-02-06 ENCOUNTER — Other Ambulatory Visit: Payer: Self-pay

## 2021-02-06 ENCOUNTER — Ambulatory Visit (INDEPENDENT_AMBULATORY_CARE_PROVIDER_SITE_OTHER): Payer: Managed Care, Other (non HMO) | Admitting: Neurology

## 2021-02-06 ENCOUNTER — Encounter: Payer: Self-pay | Admitting: Neurology

## 2021-02-06 VITALS — BP 108/70 | HR 61 | Ht 66.0 in | Wt 185.0 lb

## 2021-02-06 DIAGNOSIS — Z79899 Other long term (current) drug therapy: Secondary | ICD-10-CM | POA: Diagnosis not present

## 2021-02-06 DIAGNOSIS — F418 Other specified anxiety disorders: Secondary | ICD-10-CM

## 2021-02-06 DIAGNOSIS — G373 Acute transverse myelitis in demyelinating disease of central nervous system: Secondary | ICD-10-CM | POA: Diagnosis not present

## 2021-02-06 DIAGNOSIS — R208 Other disturbances of skin sensation: Secondary | ICD-10-CM | POA: Diagnosis not present

## 2021-02-06 DIAGNOSIS — R3915 Urgency of urination: Secondary | ICD-10-CM

## 2021-02-06 DIAGNOSIS — H469 Unspecified optic neuritis: Secondary | ICD-10-CM

## 2021-02-06 DIAGNOSIS — G35 Multiple sclerosis: Secondary | ICD-10-CM

## 2021-02-06 MED ORDER — GABAPENTIN 300 MG PO CAPS: ORAL_CAPSULE | ORAL | 5 refills | Status: DC

## 2021-02-06 NOTE — Progress Notes (Signed)
[facility]  PATIENT: Carol Berger DOB: 1991/01/10  REFERRING DOCTOR OR PCP: Dr. Roderic Palau SOURCE: Patient, recent hospitalization notes, imaging and lab reports, MRI images personally reviewed.  _________________________________   HISTORICAL  CHIEF COMPLAINT:  Chief Complaint  Patient presents with   Follow-up    Pt alone, rm 2. She is noticing concerns with left arm. Numbness,tingling and weakness. Last infusion she had residual side effects. She has not been sleeping well despite taking the gabapentin. She is noticing increase with headaches. C/o pain in middle of the back that is intermittent.    Other    Ocrevus last infused 01/01/21 and next infusion 07/16/2021. Receives at [facility]    HISTORY OF PRESENT ILLNESS:   Carol Berger is a 30 y.o. woman with relapsing remitting multiple sclerosis  UPDATE 02/06/2021: She is on  Ocrevus (since June 2021).  She has tolerated the infusions well, ast one in August 2022..   She denies any new numbness or weakness.  However, she has more myalgias the last few months in her back, neck and legs.  She has had more migraine headaches.   Last MRI were at diagnosis 07/2019.   No cervical DJD  She notes numbness and pain in the left arm.  This was worse after an infusion.    She notes a sharp pain in her neck that will shoot down the back at times.  She also notes headaches and notes feeling lightheaded upon standing at times.   She has aching and burning in her back at times.    Gait is fine.  Balance is reduced.   No falls.    She could walk a mile without stopping but feels she did better a couple years ago.   She denies weakness.   She has some numbness in the right leg.  She has urinary urgency, and frequency, better with oxybutynin   She is noting mild depression but better than last year     She notes trouble coming up with the right words.   She mixes words up at times.   She sleeps poorly some nights.     MS HISTORY: In early  2021, she had left-sided visual loss for 1 to 2 weeks that started with pain upon eye movements.  She saw optometry and then Dr. Manuella Ghazi (ophthalmology) 08/01/2019 and was referred to the [facility] emergency room.  In the emergency room an MRI of the brain was consistent with optic neuritis and multiple sclerosis and she was admitted.  She received IV Solu-Medrol for total of 5 days.  Vision improved.   She also reported tingling in her feet and hands.  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  The hand numbness seemed to develop over a week.   She had the onset of bilateral foot numbness that started on the right foot and then over a few days also involved the left foot.    This just appeared the one day.   She notes urinary frequency and urgency but no incontinence.  This came on about the same time as her leg numbness in 2020.     She also had MRI cervical spine showing lesions c/w MS.   She was started on Ocrevus 11/09/2019.      IMAGING MRI of the brain and orbits from August 02, 2019:  There is an enhancing lesion within the left optic nerve consistent with optic neuritis.  In the  brain, there are multiple T2/FLAIR hyperintense foci in the periventricular, juxtacortical and deep white matter.  5 of these enhance after contrast is administered.  The brainstem and cerebellum appear normal.  MRI of the cervical and thoracic spine August 03, 2019: There are nonenhancing foci consistent with chronic demyelination adjacent to C2-C3 (posterior very slightly left greater than right), adjacent to C3 (ventral midline), adjacent to C6 (central), T6 (central) and T11-T12 (posterior).  There were no significant degenerative changes.  LABS Pertinent lab work 08/02/2019: ESR, CRP, anti-Jo 1 were normal or negative.  The anti-double-stranded DNA antibodies were slightly positive.  Borderline anemia (he hemoglobin 11.8), normal liver function tests, normal lymphocyte  count  REVIEW OF SYSTEMS: Constitutional: No fevers, chills, sweats, or change in appetite Eyes: No visual changes, double vision, eye pain Ear, nose and throat: No hearing loss, ear pain, nasal congestion, sore throat Cardiovascular: No chest pain, palpitations Respiratory:  No shortness of breath at rest or with exertion.   No wheezes GastrointestinaI: No nausea, vomiting, diarrhea, abdominal pain, fecal incontinence Genitourinary:  No dysuria, urinary retention or frequency.  No nocturia. Musculoskeletal:  No neck pain, back pain Integumentary: No rash, pruritus, skin lesions Neurological: as above Psychiatric: No depression at this time.  No anxiety Endocrine: No palpitations, diaphoresis, change in appetite, change in weigh or increased thirst Hematologic/Lymphatic:  No anemia, purpura, petechiae. Allergic/Immunologic: No itchy/runny eyes, nasal congestion, recent allergic reactions, rashes  ALLERGIES: No Known Allergies  HOME MEDICATIONS:  Current Outpatient Medications:    acetaminophen (TYLENOL) 500 MG tablet, Take 500-1,000 mg by mouth every 6 (six) hours as needed for mild pain, moderate pain or headache. , Disp: , Rfl:    gabapentin (NEURONTIN) 300 MG capsule, One po qAM, one po qPM and two po qHS, Disp: 120 capsule, Rfl: 5   norgestrel-ethinyl estradiol (CRYSELLE-28) 0.3-30 MG-MCG tablet, Take 1 tablet by mouth daily., Disp: , Rfl:    ocrelizumab (OCREVUS) 300 MG/10ML injection, Inject into the vein once., Disp: , Rfl:    oxybutynin (DITROPAN) 5 MG tablet, Take 1 tablet (5 mg total) by mouth 2 (two) times daily. For bladder, Disp: 180 tablet, Rfl: 4   sertraline (ZOLOFT) 50 MG tablet, Take 1 tablet (50 mg total) by mouth daily., Disp: 90 tablet, Rfl: 4  PAST MEDICAL HISTORY: History reviewed. No pertinent past medical history.  PAST SURGICAL HISTORY: Past Surgical History:  Procedure Laterality Date   WISDOM TOOTH EXTRACTION     x2    FAMILY HISTORY: History  reviewed. No pertinent family history.  SOCIAL HISTORY:  Social History   Socioeconomic History   Marital status: Single    Spouse name: Not on file   Number of children: 0   Years of education: some college   Highest education level: Not on file  Occupational History   Occupation: [employer]  Tobacco Use   Smoking status: Never   Smokeless tobacco: Never  Vaping Use   Vaping Use: Never used  Substance and Sexual Activity   Alcohol use: Not Currently   Drug use: Yes    Types: Marijuana   Sexual activity: Not on file  Other Topics Concern   Not on file  Social History Narrative   Right handed    Social Determinants of Health   Financial Resource Strain: Not on file  Food Insecurity: Not on file  Transportation Needs: Not on file  Physical Activity: Not on file  Stress: Not on file  Social Connections: Not on file  Intimate Partner  Violence: Not on file     PHYSICAL EXAM  Vitals:   02/06/21 0942  BP: 108/70  Pulse: 61  Weight: 185 lb (83.9 kg)  Height: '5\' 6"'  (1.676 m)    Body mass index is 29.86 kg/m.   General: The patient is well-developed and well-nourished and in no acute distress.  Neck tenderness  HEENT:  Head is [place]/AT.   Skin: Extremities are without rash or  edema.  Neurologic Exam  Mental status: The patient is alert and oriented x 3 at the time of the examination. The patient has apparent normal recent and remote memory, with an apparently normal attention span and concentration ability.   Speech is normal.  Cranial nerves: Extraocular movements are full. Pupils  without APD.   Colors are symmetric. .  Facial symmetry is present. There is good facial sensation to soft touch bilaterally.Facial strength is normal.  Trapezius and sternocleidomastoid strength is normal. No dysarthria is noted.  The tongue is midline, and the patient has symmetric elevation of the soft palate. No obvious hearing deficits are noted.  Motor:  Muscle bulk is normal.    Tone is normal. Strength is  5 / 5 in all 4 extremities.   Sensory: Sensory testing is intact to touch, temperature and vibration.    Coordination: Cerebellar testing reveals good finger-nose-finger and heel-to-shin bilaterally.  Gait and station: Station is normal.   Gait is normal. Tandem gait is mildly wide. Romberg is negative.   Reflexes: Deep tendon reflexes are symmetric and 2 in the arms, 3 at knees and ankles      DIAGNOSTIC DATA (LABS, IMAGING, TESTING) - I reviewed patient records, labs, notes, testing and imaging myself where available.  Lab Results  Component Value Date   WBC 3.5 04/29/2020   HGB 12.5 04/29/2020   HCT 38.0 04/29/2020   MCV 90 04/29/2020   PLT 193 04/29/2020      Component Value Date/Time   NA 138 08/03/2019 0353   K 4.2 08/03/2019 0353   CL 106 08/03/2019 0353   CO2 22 08/03/2019 0353   GLUCOSE 122 (H) 08/03/2019 0353   BUN 11 08/03/2019 0353   CREATININE 0.90 08/03/2019 0353   CALCIUM 9.8 08/03/2019 0353   PROT 6.8 04/29/2020 1433   ALBUMIN 4.3 04/29/2020 1433   AST 18 04/29/2020 1433   ALT 15 04/29/2020 1433   ALKPHOS 60 04/29/2020 1433   BILITOT <0.2 04/29/2020 1433   GFRNONAA >60 08/03/2019 0353   GFRAA >60 08/03/2019 0353       ASSESSMENT AND PLAN  Relapsing remitting multiple sclerosis (HCC) - Plan: IgG, IgA, IgM, CBC with Differential/Platelet, MR BRAIN W WO CONTRAST, MR CERVICAL SPINE W WO CONTRAST  High risk medication use - Plan: IgG, IgA, IgM, CBC with Differential/Platelet  Dysesthesia  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   Continue Ocrevus.   Check labs.   We need to check MRI to determine if subclinical progression and consider a different DMT if this is occurring.     Will also check anti-NMO since presented with ON and TM.   2.   Oxybutynin for bladder  3.   Zoloft for mood 4.   Increase gabapentin to  300-300-600 for dysesthesias.   If not better, titrated up to lamotrigine 50 mg po bid.   5.   Stay active and exercise as tolerated. 5=6.   rtc 6 months  or sooner if new or worsening neuologic function   Quashon Jesus A. Felecia Shelling, MD, [facility] 3/58/2518, 98:42 AM Certified in Neurology, Clinical Neurophysiology, Sleep Medicine and Neuroimaging  [facility] [address] [place], [place] [address] [phone]

## 2021-02-12 ENCOUNTER — Telehealth: Payer: Self-pay | Admitting: Neurology

## 2021-02-12 NOTE — Telephone Encounter (Signed)
MR Brain w/wo contrast & MR Cervical spine w/wo contrast Dr. Fredonia Highland: [id number] (exp. 02/11/21 to 08/10/21).   Patient is scheduled at [facility] for 02/18/21.

## 2021-02-14 LAB — CBC WITH DIFFERENTIAL/PLATELET
Basophils Absolute: 0 10*3/uL (ref 0.0–0.2)
Basos: 1 %
EOS (ABSOLUTE): 0.1 10*3/uL (ref 0.0–0.4)
Eos: 3 %
Hematocrit: 38.6 % (ref 34.0–46.6)
Hemoglobin: 12.1 g/dL (ref 11.1–15.9)
Immature Grans (Abs): 0 10*3/uL (ref 0.0–0.1)
Immature Granulocytes: 0 %
Lymphocytes Absolute: 2.2 10*3/uL (ref 0.7–3.1)
Lymphs: 48 %
MCH: 28.5 pg (ref 26.6–33.0)
MCHC: 31.3 g/dL — ABNORMAL LOW (ref 31.5–35.7)
MCV: 91 fL (ref 79–97)
Monocytes Absolute: 0.5 10*3/uL (ref 0.1–0.9)
Monocytes: 10 %
Neutrophils Absolute: 1.7 10*3/uL (ref 1.4–7.0)
Neutrophils: 38 %
Platelets: 186 10*3/uL (ref 150–450)
RBC: 4.24 x10E6/uL (ref 3.77–5.28)
RDW: 13.1 % (ref 11.7–15.4)
WBC: 4.5 10*3/uL (ref 3.4–10.8)

## 2021-02-14 LAB — IGG, IGA, IGM
IgA/Immunoglobulin A, Serum: 140 mg/dL (ref 87–352)
IgG (Immunoglobin G), Serum: 1333 mg/dL (ref 586–1602)
IgM (Immunoglobulin M), Srm: 117 mg/dL (ref 26–217)

## 2021-02-14 LAB — NEUROMYELITIS OPTICA AUTOAB, IGG: NMO IgG Autoantibodies: <1.5 U/mL (ref 0.0–3.0)

## 2021-02-18 ENCOUNTER — Ambulatory Visit (INDEPENDENT_AMBULATORY_CARE_PROVIDER_SITE_OTHER): Payer: Managed Care, Other (non HMO)

## 2021-02-18 DIAGNOSIS — G35 Multiple sclerosis: Secondary | ICD-10-CM

## 2021-02-18 MED ORDER — GADOBENATE DIMEGLUMINE 529 MG/ML IV SOLN
15.0000 mL | Freq: Once | INTRAVENOUS | Status: AC | PRN
  Administered 2021-02-18: 15 mL via INTRAVENOUS

## 2021-07-03 DIAGNOSIS — Z0289 Encounter for other administrative examinations: Secondary | ICD-10-CM

## 2021-07-08 ENCOUNTER — Telehealth: Payer: Self-pay | Admitting: *Deleted

## 2021-07-08 NOTE — Telephone Encounter (Signed)
Gave completed/signed [employer]ML[employer] back to medical records to process for pt.

## 2021-07-09 ENCOUNTER — Telehealth: Payer: Self-pay | Admitting: *Deleted

## 2021-07-09 NOTE — Telephone Encounter (Deleted)
I faxed pt [employer] form on 07/09/21 to [phone]

## 2021-07-09 NOTE — Telephone Encounter (Signed)
I faxed pt FMLA/[employer] form on 07/09/21

## 2021-08-06 ENCOUNTER — Ambulatory Visit (INDEPENDENT_AMBULATORY_CARE_PROVIDER_SITE_OTHER): Payer: Managed Care, Other (non HMO) | Admitting: Family Medicine

## 2021-08-06 ENCOUNTER — Other Ambulatory Visit: Payer: Self-pay

## 2021-08-06 ENCOUNTER — Encounter: Payer: Self-pay | Admitting: Family Medicine

## 2021-08-06 VITALS — BP 106/67 | HR 72 | Ht 65.0 in | Wt 187.0 lb

## 2021-08-06 DIAGNOSIS — R3915 Urgency of urination: Secondary | ICD-10-CM

## 2021-08-06 DIAGNOSIS — Z79899 Other long term (current) drug therapy: Secondary | ICD-10-CM | POA: Diagnosis not present

## 2021-08-06 DIAGNOSIS — R208 Other disturbances of skin sensation: Secondary | ICD-10-CM | POA: Diagnosis not present

## 2021-08-06 DIAGNOSIS — F32A Depression, unspecified: Secondary | ICD-10-CM

## 2021-08-06 DIAGNOSIS — G35 Multiple sclerosis: Secondary | ICD-10-CM | POA: Diagnosis not present

## 2021-08-06 DIAGNOSIS — F419 Anxiety disorder, unspecified: Secondary | ICD-10-CM

## 2021-08-06 MED ORDER — AMITRIPTYLINE HCL 10 MG PO TABS: 10.0000 mg | ORAL_TABLET | Freq: Every day | ORAL | 1 refills | Status: DC

## 2021-08-06 NOTE — Patient Instructions (Signed)
Below is our plan: ? ?We will continue Ocrevus infusions every 6 months. We will update labs, today. We will start amitriptyline 10mg  daily for headache prevention. Continue Excedrin as needed for abortive therapy. We will send a referral to psychiatry. Please let me know if you have any worsening symptoms. I have given you information for support if you have any suicidal ideation.  ? ?Please make sure you are staying well hydrated. I recommend 50-60 ounces daily. Well balanced diet and regular exercise encouraged. Consistent sleep schedule with 6-8 hours recommended.  ? ?Please continue follow up with care team as directed.  ? ?Follow up with Dr in 6 months  ? ?You may receive a survey regarding today's visit. I encourage you to leave honest feed back as I do use this information to improve patient care. Thank you for seeing me today!  ? ? ?

## 2021-08-06 NOTE — Progress Notes (Signed)
? ? ?Chief Complaint  ?Patient presents with  ? Follow-up  ?  Rm 8,  received ocrevus at [place]. Last infusion was 07/21/21. Next infusion scheduled for September. Pt has been having problems with migraines and could be stress related. Has had moments of dizziness and light headed.   ? ? ? ?HISTORY OF PRESENT ILLNESS: ? ?08/06/21 ALL:  ?Carol Berger is a 31 y.o. female here today for follow up for for RRMS. She continues Ocrevus infusions. Last infusion was 07/21/2021. Labs have been normal. MRI brain and cervical spine stable 02/2021.  ? ?She feels that MS symptoms are stable. No new or exacerbating symptoms. Gait is normal. No difficulty with balance.  ? ?Dr Felecia Shelling increased gabapentin to 300/300/600 for worsening dysesthesias. She reports that she was unable to maintain dosing schedule due to work schedule. She felt groggy the next morning. She continues to have left arm numbness and pain. She feels that her left arm is weaker than the right.  ? ?She is having headaches fairly regularly. She has 2-3 headaches per week. They are usually bilateral but can be unilateral. Usually start in the back of her head but radiate up to the eye. She describes pain as a sharp/achying/throbbing. No obvious migraine symptoms. She has some dizziness/lightheadedness. She usually takes Excedrin that helps a little bit. No vision changes. She drinks about 2 bottles of water daily. No syncope.  ? ?Mood is poor. She is having more anxiety and depression. She has significant stressors at home. She denies suicidal ideations but reports she has had thought in the distant past. Sertraline was not effective and discontinued about a year ago. Sleep is not great. She works third shift (6p-6a) in the lab. She may get about 6 hours of sleep each night. She usually sleeps soundly for this time frame.  ? ?She continues oxybutynin 51m daily for urinary frequency.  ? ? ?HISTORY (copied from previous note) ? ?Carol Acheampongis a 31y.o. woman with  relapsing remitting multiple sclerosis ?  ?UPDATE 02/06/2021: ?She is on  Ocrevus (since June 2021).  She has tolerated the infusions well, ast one in August 2022..   She denies any new numbness or weakness.  However, she has more myalgias the last few months in her back, neck and legs.  She has had more migraine headaches.   Last MRI were at diagnosis 07/2019.   No cervical DJD ?  ?She notes numbness and pain in the left arm.  This was worse after an infusion.   She notes a sharp pain in her neck that will shoot down the back at times.  She also notes headaches and notes feeling lightheaded upon standing at times.   She has aching and burning in her back at times.   ?  ?Gait is fine.  Balance is reduced.   No falls.    She could walk a mile without stopping but feels she did better a couple years ago.   She denies weakness.   She has some numbness in the right leg.  She has urinary urgency, and frequency, better with oxybutynin  ?  ?She is noting mild depression but better than last year     She notes trouble coming up with the right words.   She mixes words up at times.   She sleeps poorly some nights.    ?  ?MS HISTORY: ?In early 2021, she had left-sided visual loss for 1 to 2 weeks that started with pain upon  eye movements.  She saw optometry and then Dr. Manuella Ghazi (ophthalmology) 08/01/2019 and was referred to the [facility] emergency room.  In the emergency room an MRI of the brain was consistent with optic neuritis and multiple sclerosis and she was admitted.  She received IV Solu-Medrol for total of 5 days.  Vision improved.   She also reported tingling in her feet and hands.  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  The hand numbness seemed to develop over a week.   She had the onset of bilateral foot numbness that started on the right foot and then over a few days also involved the left foot.    This just appeared the one day.   She notes urinary frequency and  urgency but no incontinence.  This came on about the same time as her leg numbness in 2020.     She also had MRI cervical spine showing lesions c/w MS.   She was started on Ocrevus 11/09/2019.    ?  ?IMAGING ?MRI of the brain and orbits from August 02, 2019:  There is an enhancing lesion within the left optic nerve consistent with optic neuritis.  In the brain, there are multiple T2/FLAIR hyperintense foci in the periventricular, juxtacortical and deep white matter.  5 of these enhance after contrast is administered.  The brainstem and cerebellum appear normal. ?  ?MRI of the cervical and thoracic spine August 03, 2019: There are nonenhancing foci consistent with chronic demyelination adjacent to C2-C3 (posterior very slightly left greater than right), adjacent to C3 (ventral midline), adjacent to C6 (central), T6 (central) and T11-T12 (posterior).  There were no significant degenerative changes. ?  ?LABS ?Pertinent lab work 08/02/2019: ESR, CRP, anti-Jo 1 were normal or negative.  The anti-double-stranded DNA antibodies were slightly positive.  Borderline anemia (he hemoglobin 11.8), normal liver function tests, normal lymphocyte count ? ?REVIEW OF SYSTEMS: Out of a complete 14 system review of symptoms, the patient complains only of the following symptoms, dysesthesias, muscle cramps, headaches, insomnia, fatigue and all other reviewed systems are negative. ? ? ? ?ALLERGIES: ?No Known Allergies ? ? ?HOME MEDICATIONS: ?Outpatient Medications Prior to Visit  ?Medication Sig Dispense Refill  ? acetaminophen (TYLENOL) 500 MG tablet Take 500-1,000 mg by mouth every 6 (six) hours as needed for mild pain, moderate pain or headache.     ? gabapentin (NEURONTIN) 300 MG capsule One po qAM, one po qPM and two po qHS 120 capsule 5  ? norgestrel-ethinyl estradiol (CRYSELLE-28) 0.3-30 MG-MCG tablet Take 1 tablet by mouth daily.    ? ocrelizumab (OCREVUS) 300 MG/10ML injection Inject into the vein once.    ? oxybutynin (DITROPAN) 5  MG tablet Take 1 tablet (5 mg total) by mouth 2 (two) times daily. For bladder 180 tablet 4  ? sertraline (ZOLOFT) 50 MG tablet Take 1 tablet (50 mg total) by mouth daily. 90 tablet 4  ? ?No facility-administered medications prior to visit.  ? ? ? ?PAST MEDICAL HISTORY: ?History reviewed. No pertinent past medical history. ? ? ?PAST SURGICAL HISTORY: ?Past Surgical History:  ?Procedure Laterality Date  ? WISDOM TOOTH EXTRACTION    ? x2  ? ? ? ?FAMILY HISTORY: ?History reviewed. No pertinent family history. ? ? ?SOCIAL HISTORY: ?Social History  ? ?Socioeconomic History  ? Marital status: Single  ?  Spouse name: Not on file  ? Number of children: 0  ? Years of education: some college  ?  Highest education level: Not on file  ?Occupational History  ? Occupation: [employer]  ?Tobacco Use  ? Smoking status: Never  ? Smokeless tobacco: Never  ?Vaping Use  ? Vaping Use: Never used  ?Substance and Sexual Activity  ? Alcohol use: Not Currently  ? Drug use: Yes  ?  Types: Marijuana  ? Sexual activity: Not on file  ?Other Topics Concern  ? Not on file  ?Social History Narrative  ? Right handed   ? ?Social Determinants of Health  ? ?Financial Resource Strain: Not on file  ?Food Insecurity: Not on file  ?Transportation Needs: Not on file  ?Physical Activity: Not on file  ?Stress: Not on file  ?Social Connections: Not on file  ?Intimate Partner Violence: Not on file  ? ? ? ? ?PHYSICAL EXAM ? ?Vitals:  ? 08/06/21 1123  ?BP: 106/67  ?Pulse: 72  ?Weight: 187 lb (84.8 kg)  ?Height: '5\' 5"'  (1.651 m)  ? ? ?Body mass index is 31.12 kg/m?. ? ? ?Generalized: Well developed, in no acute distress ? ?Cardiology: normal rate and rhythm, no murmur auscultated  ?Respiratory: clear to auscultation bilaterally   ? ?Neurological examination  ?Mentation: Alert oriented to time, place, history taking. Follows all commands speech and language fluent ?Cranial nerve II-XII: Pupils were equal round reactive to light. Extraocular movements were full, visual  field were full on confrontational test. Facial sensation and strength were normal. Head turning and shoulder shrug  were normal and symmetric. ?Motor: The motor testing reveals 5 over 5 strength of all 4 extremiti

## 2021-08-07 LAB — CBC WITH DIFFERENTIAL/PLATELET
Basophils Absolute: 0 10*3/uL (ref 0.0–0.2)
Basos: 1 %
EOS (ABSOLUTE): 0.1 10*3/uL (ref 0.0–0.4)
Eos: 2 %
Hematocrit: 39.5 % (ref 34.0–46.6)
Hemoglobin: 13 g/dL (ref 11.1–15.9)
Immature Grans (Abs): 0 10*3/uL (ref 0.0–0.1)
Immature Granulocytes: 0 %
Lymphocytes Absolute: 1.6 10*3/uL (ref 0.7–3.1)
Lymphs: 44 %
MCH: 29 pg (ref 26.6–33.0)
MCHC: 32.9 g/dL (ref 31.5–35.7)
MCV: 88 fL (ref 79–97)
Monocytes Absolute: 0.4 10*3/uL (ref 0.1–0.9)
Monocytes: 11 %
Neutrophils Absolute: 1.5 10*3/uL (ref 1.4–7.0)
Neutrophils: 42 %
Platelets: 189 10*3/uL (ref 150–450)
RBC: 4.49 x10E6/uL (ref 3.77–5.28)
RDW: 12.6 % (ref 11.7–15.4)
WBC: 3.6 10*3/uL (ref 3.4–10.8)

## 2021-08-07 LAB — IGG, IGA, IGM
IgA/Immunoglobulin A, Serum: 147 mg/dL (ref 87–352)
IgG (Immunoglobin G), Serum: 1258 mg/dL (ref 586–1602)
IgM (Immunoglobulin M), Srm: 118 mg/dL (ref 26–217)

## 2021-09-17 ENCOUNTER — Telehealth: Payer: Self-pay | Admitting: Family Medicine

## 2021-09-17 MED ORDER — OXYBUTYNIN CHLORIDE 5 MG PO TABS: 5.0000 mg | ORAL_TABLET | Freq: Every day | ORAL | 3 refills | Status: DC

## 2021-09-17 NOTE — Telephone Encounter (Signed)
Pt is requesting a refill for oxybutynin (DITROPAN) 5 MG tablet. ? ?Pharmacy: [facility] [phone]  on [address] [place], [place] ?Pt is asking if the prescription can be ?Changed, she is only taking 1 a day, not 2. ? ?

## 2021-09-17 NOTE — Telephone Encounter (Signed)
E-scribed rx 

## 2021-10-20 ENCOUNTER — Telehealth: Payer: Self-pay | Admitting: Family Medicine

## 2021-10-20 NOTE — Telephone Encounter (Signed)
Pt said, Healthcare Provider Medical Leave Form. Was faxed with the wrong date, the correct date is 06/20/2021. Need form back dated and refax. Would like a call from the nurse.

## 2021-10-21 NOTE — Telephone Encounter (Signed)
Called pt back. Informed her Dr. Epimenio Foot approved change. We have adjusted form and will have Stanton Kidney in MR resend form for her. She verbalized understanding.

## 2021-10-22 NOTE — Telephone Encounter (Signed)
Called pt back. Advised the max that we provide is 4x/year (1-5 days per episode) and we also provided time for appt (1 x q 6 month, 2-3 hr for each appt). She verbalized understanding.

## 2021-10-22 NOTE — Telephone Encounter (Signed)
Pt called stating that the duration on her FMLA is wrong. She states that she gets 4-6 a month not year. Please advise. If pt does not answer she is at work and states it is ok to leave detailed message on cell phone vm.

## 2021-12-01 ENCOUNTER — Telehealth: Payer: Self-pay | Admitting: Family Medicine

## 2021-12-01 NOTE — Telephone Encounter (Signed)
Pt asking what the process in order to start a disability claim. Would like a call from the nurse.

## 2021-12-01 NOTE — Telephone Encounter (Signed)
Called pt back. Still having mood related issues. Started on amitriptyline but made sx worse. Stopped this. She got part time job this past April. She has been working everyday because she also has full time job. She is very fatigued. She left work this past Thursday early. Has to be in air conditioned room d/t chemicals that are present. Once of the only rooms that has air. However, work was having difficulty with air working properly. Got sick/threw up/got headache.  Her last visit was 07/2021 AL,NP. Scheduled work in appt tomorrow at 330pm/check in 3p with Dr. Epimenio Foot. She will bring forms needing completion with her to appt.

## 2021-12-02 ENCOUNTER — Ambulatory Visit (INDEPENDENT_AMBULATORY_CARE_PROVIDER_SITE_OTHER): Payer: Managed Care, Other (non HMO) | Admitting: Neurology

## 2021-12-02 ENCOUNTER — Encounter: Payer: Self-pay | Admitting: Neurology

## 2021-12-02 VITALS — BP 114/75 | HR 68 | Ht 65.0 in | Wt 200.8 lb

## 2021-12-02 DIAGNOSIS — Z79899 Other long term (current) drug therapy: Secondary | ICD-10-CM

## 2021-12-02 DIAGNOSIS — G35 Multiple sclerosis: Secondary | ICD-10-CM

## 2021-12-02 DIAGNOSIS — G373 Acute transverse myelitis in demyelinating disease of central nervous system: Secondary | ICD-10-CM

## 2021-12-02 DIAGNOSIS — H469 Unspecified optic neuritis: Secondary | ICD-10-CM | POA: Diagnosis not present

## 2021-12-02 DIAGNOSIS — Z0289 Encounter for other administrative examinations: Secondary | ICD-10-CM

## 2021-12-02 MED ORDER — VENLAFAXINE HCL ER 75 MG PO CP24: 75.0000 mg | ORAL_CAPSULE | Freq: Every day | ORAL | 3 refills | Status: DC

## 2021-12-02 NOTE — Progress Notes (Signed)
[facility]  PATIENT: Carol Berger DOB: July 15, 1990  REFERRING DOCTOR OR PCP: Dr. Roderic Palau SOURCE: Patient, recent hospitalization notes, imaging and lab reports, MRI images personally reviewed.  _________________________________   HISTORICAL  CHIEF COMPLAINT:  Chief Complaint  Patient presents with   Follow-up    Rm 16, alone. Here to discuss about going on disability.     HISTORY OF PRESENT ILLNESS:   Carol Berger is a 31 y.o. woman with relapsing remitting multiple sclerosis  UPDATE 12/02/2021: She is on  Ocrevus (next one September 2023).  She has tolerated the infusions well..     No exacerbations.   She denies any new numbness or weakness.    Gait is fine.  Balance is reduced.   No falls.    She could walk a mile if temperature not elevated but much less if the tem is hot.     She notes numbness and pain in the left arm.  Sometimes the right arm tingle.  She denies weakness.   She has some numbness in the right leg.  She has urinary urgency, and frequency, better with oxybutynin .  BID dries her out though.    She is sleeping worse and also notes more fatigue since starting a different job.  She feels she gets only 5-6 hours sleep some nights.   She has myalgias and muscle spasms.     She is noting some  depression but better than last year    Amitriptyline and sertraline had not helped in past.     She notes trouble coming up with the right words.   She mixes words up at times.        She has needed FMLA due to having more pain.    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     MS HISTORY: In early 2021, she had left-sided visual loss for 1 to 2 weeks that started with pain upon eye movements.  She saw optometry and then Dr. Manuella Ghazi (ophthalmology) 08/01/2019 and was referred to the [facility] emergency room.  In the emergency room an MRI of the brain was consistent with optic neuritis and multiple sclerosis  and she was admitted.  She received IV Solu-Medrol for total of 5 days.  Vision improved.   She also reported tingling in her feet and hands.  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  The hand numbness seemed to develop over a week.   She had the onset of bilateral foot numbness that started on the right foot and then over a few days also involved the left foot.    This just appeared the one day.   She notes urinary frequency and urgency but no incontinence.  This came on about the same time as her leg numbness in 2020.     She also had MRI cervical spine showing lesions c/w MS.   She was started on Ocrevus 11/09/2019.      IMAGING MRI of the brain and orbits from August 02, 2019:  There is an enhancing lesion within the left optic nerve consistent with optic neuritis.  In the brain, there are multiple T2/FLAIR hyperintense foci in the periventricular, juxtacortical and deep white matter.  5 of these enhance after contrast is administered.  The brainstem and cerebellum appear normal.  MRI of the cervical and thoracic spine August 03, 2019: There  are nonenhancing foci consistent with chronic demyelination adjacent to C2-C3 (posterior very slightly left greater than right), adjacent to C3 (ventral midline), adjacent to C6 (central), T6 (central) and T11-T12 (posterior).  There were no significant degenerative changes.  MRI brain 02/18/2021 no new lesions compared to 08/02/2019  MRI cervical spine 02/18/2021 showed T2 hyperintense foci within the spinal cord adjacent to C2-C3, C3, and C5-C6.  None of the foci enhance or appear to be acute.  These are consistent with chronic demyelinating plaque associated with multiple sclerosis.      Small disc osteophyte complex to the right at C3-C4.  No nerve root compression.  Degenerative changes are not noted at other cervical levels.   LABS Pertinent lab work 08/02/2019: ESR, CRP, anti-Jo 1 were normal or negative.  The  anti-double-stranded DNA antibodies were slightly positive.  Borderline anemia (he hemoglobin 11.8), normal liver function tests, normal lymphocyte count  REVIEW OF SYSTEMS: Constitutional: No fevers, chills, sweats, or change in appetite Eyes: No visual changes, double vision, eye pain Ear, nose and throat: No hearing loss, ear pain, nasal congestion, sore throat Cardiovascular: No chest pain, palpitations Respiratory:  No shortness of breath at rest or with exertion.   No wheezes GastrointestinaI: No nausea, vomiting, diarrhea, abdominal pain, fecal incontinence Genitourinary:  No dysuria, urinary retention or frequency.  No nocturia. Musculoskeletal:  No neck pain, back pain Integumentary: No rash, pruritus, skin lesions Neurological: as above Psychiatric: No depression at this time.  No anxiety Endocrine: No palpitations, diaphoresis, change in appetite, change in weigh or increased thirst Hematologic/Lymphatic:  No anemia, purpura, petechiae. Allergic/Immunologic: No itchy/runny eyes, nasal congestion, recent allergic reactions, rashes  ALLERGIES: No Known Allergies  HOME MEDICATIONS:  Current Outpatient Medications:    acetaminophen (TYLENOL) 500 MG tablet, Take 500-1,000 mg by mouth every 6 (six) hours as needed for mild pain, moderate pain or headache. , Disp: , Rfl:    norgestrel-ethinyl estradiol (CRYSELLE-28) 0.3-30 MG-MCG tablet, Take 1 tablet by mouth daily., Disp: , Rfl:    ocrelizumab (OCREVUS) 300 MG/10ML injection, Inject into the vein once., Disp: , Rfl:    oxybutynin (DITROPAN) 5 MG tablet, Take 1 tablet (5 mg total) by mouth daily. For bladder, Disp: 90 tablet, Rfl: 3   venlafaxine XR (EFFEXOR XR) 75 MG 24 hr capsule, Take 1 capsule (75 mg total) by mouth daily with breakfast., Disp: 90 capsule, Rfl: 3  PAST MEDICAL HISTORY: No past medical history on file.  PAST SURGICAL HISTORY: Past Surgical History:  Procedure Laterality Date   WISDOM TOOTH EXTRACTION      x2    FAMILY HISTORY: No family history on file.  SOCIAL HISTORY:  Social History   Socioeconomic History   Marital status: Single    Spouse name: Not on file   Number of children: 0   Years of education: some college   Highest education level: Not on file  Occupational History   Occupation: [employer]  Tobacco Use   Smoking status: Never   Smokeless tobacco: Never  Vaping Use   Vaping Use: Never used  Substance and Sexual Activity   Alcohol use: Not Currently   Drug use: Yes    Types: Marijuana   Sexual activity: Not on file  Other Topics Concern   Not on file  Social History Narrative   Right handed    Social Determinants of Health   Financial Resource Strain: Not on file  Food Insecurity: Not on file  Transportation Needs: Not on file  Physical  Activity: Not on file  Stress: Not on file  Social Connections: Not on file  Intimate Partner Violence: Not on file     PHYSICAL EXAM  Vitals:   12/02/21 1533  BP: 114/75  Pulse: 68  Weight: 200 lb 12.8 oz (91.1 kg)  Height: _0  (1.651 m)    Body mass index is 33.41 kg/m.   General: The patient is well-developed and well-nourished and in no acute distress.  Neck tenderness  HEENT:  Head is [place]/AT.   Skin: Extremities are without rash or  edema.  Neurologic Exam  Mental status: The patient is alert and oriented x 3 at the time of the examination. The patient has apparent normal recent and remote memory, with an apparently normal attention span and concentration ability.   Speech is normal.  Cranial nerves: Extraocular movements are full.   Facial symmetry is present. There is good facial sensation to soft touch bilaterally.Facial strength is normal.  Trapezius and sternocleidomastoid strength is normal. No dysarthria is noted.   No obvious hearing deficits are noted.  Motor:  Muscle bulk is normal.   Tone is normal. Strength is  5 / 5 in all 4 extremities.   Sensory: Sensory testing is intact to touch,  temperature and vibration in arms but reduced left leg.    Coordination: Cerebellar testing reveals good finger-nose-finger and heel-to-shin bilaterally.  Gait and station: Station is normal.   Gait is normal. Tandem gait is mildly wide. Romberg is negative.   Reflexes: Deep tendon reflexes are symmetric and 2 in the arms, 3 at knees and ankles      DIAGNOSTIC DATA (LABS, IMAGING, TESTING) - I reviewed patient records, labs, notes, testing and imaging myself where available.  Lab Results  Component Value Date   WBC 3.6 08/06/2021   HGB 13.0 08/06/2021   HCT 39.5 08/06/2021   MCV 88 08/06/2021   PLT 189 08/06/2021      Component Value Date/Time   NA 138 08/03/2019 0353   K 4.2 08/03/2019 0353   CL 106 08/03/2019 0353   CO2 22 08/03/2019 0353   GLUCOSE 122 (H) 08/03/2019 0353   BUN 11 08/03/2019 0353   CREATININE 0.90 08/03/2019 0353   CALCIUM 9.8 08/03/2019 0353   PROT 6.8 04/29/2020 1433   ALBUMIN 4.3 04/29/2020 1433   AST 18 04/29/2020 1433   ALT 15 04/29/2020 1433   ALKPHOS 60 04/29/2020 1433   BILITOT <0.2 04/29/2020 1433   GFRNONAA >60 08/03/2019 0353   GFRAA >60 08/03/2019 0353       ASSESSMENT AND PLAN  Relapsing remitting multiple sclerosis (HCC) - Plan: CBC with Differential/Platelet, IgG, IgA, IgM  High risk medication use - Plan: CBC with Differential/Platelet, IgG, IgA, IgM  Transverse myelitis (HCC) - Plan: Anti-MOG, Serum  Optic neuritis, left - Plan: Anti-MOG, Serum  1.   Continue Ocrevus.   Check labs.   MRI in 2024.    2.   Oxybutynin for bladder  3.   Effexor for mood 4.   Increase gabapentin to 300-300-600 for dysesthesias.   If not better, titrated up to lamotrigine 50 mg po bid.   5.   Adjust FMLA -up to 2 episodes per month with 1 or 2 days per episode or 4 days/month 6.   rtc 6 months or sooner if new or worsening neuologic function   Nichola Cieslinski A. Felecia Shelling, MD, [facility] 11/14/5282, 1:32 PM Certified in Neurology, Clinical Neurophysiology,  Sleep Medicine and Neuroimaging  [facility]  [address], [place], [place] [address] [phone]

## 2021-12-02 NOTE — Progress Notes (Signed)
Gave updated FMLA to MR to process for pt. Dr. Epimenio Foot providing up to 2 days/episode or 4 days/month.

## 2021-12-03 LAB — CBC WITH DIFFERENTIAL/PLATELET
Basophils Absolute: 0 10*3/uL (ref 0.0–0.2)
Basos: 1 %
EOS (ABSOLUTE): 0.1 10*3/uL (ref 0.0–0.4)
Eos: 3 %
Hematocrit: 37.7 % (ref 34.0–46.6)
Hemoglobin: 12.5 g/dL (ref 11.1–15.9)
Immature Grans (Abs): 0 10*3/uL (ref 0.0–0.1)
Immature Granulocytes: 0 %
Lymphocytes Absolute: 1.3 10*3/uL (ref 0.7–3.1)
Lymphs: 32 %
MCH: 29.5 pg (ref 26.6–33.0)
MCHC: 33.2 g/dL (ref 31.5–35.7)
MCV: 89 fL (ref 79–97)
Monocytes Absolute: 0.4 10*3/uL (ref 0.1–0.9)
Monocytes: 9 %
Neutrophils Absolute: 2.2 10*3/uL (ref 1.4–7.0)
Neutrophils: 55 %
Platelets: 188 10*3/uL (ref 150–450)
RBC: 4.24 x10E6/uL (ref 3.77–5.28)
RDW: 12.8 % (ref 11.7–15.4)
WBC: 4.1 10*3/uL (ref 3.4–10.8)

## 2021-12-03 LAB — ANTI-MOG, SERUM: MOG Antibody, Cell-based IFA: NEGATIVE

## 2021-12-03 LAB — IGG, IGA, IGM
IgA/Immunoglobulin A, Serum: 126 mg/dL (ref 87–352)
IgG (Immunoglobin G), Serum: 1228 mg/dL (ref 586–1602)
IgM (Immunoglobulin M), Srm: 105 mg/dL (ref 26–217)

## 2021-12-08 ENCOUNTER — Telehealth: Payer: Self-pay | Admitting: *Deleted

## 2021-12-08 NOTE — Telephone Encounter (Signed)
Gave completed/signed accomodation request form back to [facility] to process for pt.

## 2021-12-10 NOTE — Telephone Encounter (Signed)
$  50 form fee paid. Paperwork faxed and patient made aware of completion. Copy mailed to patient at patients request

## 2021-12-11 ENCOUNTER — Encounter: Payer: Self-pay | Admitting: Family Medicine

## 2022-01-16 ENCOUNTER — Encounter (HOSPITAL_COMMUNITY): Payer: Self-pay | Admitting: *Deleted

## 2022-01-16 ENCOUNTER — Ambulatory Visit (HOSPITAL_COMMUNITY)
Admission: EM | Admit: 2022-01-16 | Discharge: 2022-01-16 | Disposition: A | Discharge status: Home / Self Care | Payer: Managed Care, Other (non HMO) | Attending: Urgent Care | Admitting: Urgent Care

## 2022-01-16 DIAGNOSIS — R197 Diarrhea, unspecified: Secondary | ICD-10-CM | POA: Insufficient documentation

## 2022-01-16 DIAGNOSIS — Z20822 Contact with and (suspected) exposure to covid-19: Secondary | ICD-10-CM | POA: Insufficient documentation

## 2022-01-16 DIAGNOSIS — R112 Nausea with vomiting, unspecified: Secondary | ICD-10-CM | POA: Insufficient documentation

## 2022-01-16 HISTORY — DX: Anxiety disorder, unspecified: F41.9

## 2022-01-16 HISTORY — DX: Multiple sclerosis: G35

## 2022-01-16 HISTORY — DX: Depression, unspecified: F32.A

## 2022-01-16 HISTORY — DX: Multiple sclerosis, unspecified: G35.D

## 2022-01-16 LAB — POC INFLUENZA A AND B ANTIGEN (URGENT CARE ONLY)
INFLUENZA A ANTIGEN, POC: NEGATIVE
INFLUENZA B ANTIGEN, POC: NEGATIVE

## 2022-01-16 LAB — SARS CORONAVIRUS 2 BY RT PCR: SARS Coronavirus 2 by RT PCR: NEGATIVE

## 2022-01-16 MED ORDER — ONDANSETRON 4 MG PO TBDP
4.0000 mg | ORAL_TABLET | Freq: Once | ORAL | Status: AC
Start: 2022-01-16 — End: 2022-01-16
  Administered 2022-01-16: 4 mg via ORAL

## 2022-01-16 MED ORDER — ONDANSETRON 4 MG PO TBDP: 4.0000 mg | ORAL_TABLET | Freq: Three times a day (TID) | ORAL | 0 refills | Status: DC | PRN

## 2022-01-16 MED ORDER — ONDANSETRON 4 MG PO TBDP
ORAL_TABLET | ORAL | Status: AC
  Filled 2022-01-16: qty 1

## 2022-01-16 NOTE — ED Provider Notes (Signed)
[facility]    CSN: [id number] Arrival date & time: 01/16/22  1856      History   Chief Complaint Chief Complaint  Patient presents with   Emesis    HPI Carol Berger is a 31 y.o. female.   31 year old female presents today due to cute onset of nausea vomiting and diarrhea starting this morning.  She has had 2 bouts of some mild loose stools, and 3 episodes of vomiting.  She is uncertain if it is related to something she ate.  She works at [employer], and states she ate her usual meal last evening.  She denies any known sick contact exposure. She had a headache earlier, but denies one now. Took a nap earlier and headache resolved. Tried to eat a taco, but was unable to tolerate. Has not eaten or drank anything since this morning. Denies fever, chills, but endorses mild body aches. Denies any URI sx. Mild upper abdominal discomfort. Pt does have hx of MS, feels that her body aches are not changed from her baseline.    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   Depression with anxiety 04/29/2020   High risk medication use 08/31/2019   Other fatigue 08/17/2019   Urinary urgency 08/17/2019   Numbness 08/17/2019   Obesity, Class II, BMI 35-39.9 08/03/2019   Optic neuritis, left 08/02/2019   Relapsing remitting multiple sclerosis (HCC) 08/02/2019    Past Surgical History:  Procedure Laterality Date   WISDOM TOOTH EXTRACTION     x2    OB History     Gravida  1   Para      Term      Preterm      AB  1   Living         SAB  1   IAB      Ectopic      Multiple      Live Births               Home Medications    Prior to Admission  medications   Medication Sig Start Date End Date Taking? Authorizing Provider  norgestrel-ethinyl estradiol (CRYSELLE-28) 0.3-30 MG-MCG tablet Take 1 tablet by mouth daily.   Yes [provider]  ocrelizumab (OCREVUS) 300 MG/10ML injection Inject into the vein once.   Yes [provider]  ondansetron (ZOFRAN-ODT) 4 MG disintegrating tablet Take 1 tablet (4 mg total) by mouth every 8 (eight) hours as needed for nausea or vomiting. 01/16/22  Yes Kaelynn Igo L, PA  oxybutynin (DITROPAN) 5 MG tablet Take 1 tablet (5 mg total) by mouth daily. For bladder 09/17/21  Yes Lomax, Amy, NP  venlafaxine XR (EFFEXOR XR) 75 MG 24 hr capsule Take 1 capsule (75 mg total) by mouth daily with breakfast. 12/02/21  Yes Sater, Pearletha Furl, MD    Family History History reviewed. No pertinent family history.  Social History Social History   Tobacco Use   Smoking status: Never   Smokeless tobacco: Never  Vaping Use   Vaping Use: Never used  Substance Use Topics   Alcohol use: Yes    Comment: socially  Drug use: Yes    Types: Marijuana     Allergies   Patient has no known allergies.   Review of Systems Review of Systems  Constitutional:  Positive for appetite change. Negative for chills and fever.  Gastrointestinal:  Positive for abdominal pain (mild upper abdominal), diarrhea, nausea and vomiting.  All other systems reviewed and are negative.    Physical Exam Triage Vital Signs ED Triage Vitals  Enc Vitals Group     BP 01/16/22 1910 118/67     Pulse Rate 01/16/22 1910 94     Resp 01/16/22 1910 18     Temp 01/16/22 1910 98.8 F (37.1 C)     Temp Source 01/16/22 1910 Oral     SpO2 01/16/22 1910 99 %     Weight --      Height --      Head Circumference --      Peak Flow --      Pain Score 01/16/22 1907 6     Pain Loc --      Pain Edu? --      Excl. in GC? --    No data found.  Updated Vital Signs BP 118/67 (BP Location: Left Arm)   Pulse 94   Temp 98.8 F (37.1 C)  (Oral)   Resp 18   LMP 12/30/2021 (Approximate)   SpO2 99%   Visual Acuity Right Eye Distance:   Left Eye Distance:   Bilateral Distance:    Right Eye Near:   Left Eye Near:    Bilateral Near:     Physical Exam Vitals and nursing note reviewed.  Constitutional:      General: She is not in acute distress.    Appearance: Normal appearance. She is well-developed. She is ill-appearing. She is not toxic-appearing.     Comments: Pt vomited x 1 in room  HENT:     Head: Normocephalic and atraumatic.     Right Ear: External ear normal.     Left Ear: External ear normal.     Nose: Nose normal.     Mouth/Throat:     Mouth: Mucous membranes are moist.     Pharynx: Oropharynx is clear. No oropharyngeal exudate or posterior oropharyngeal erythema.  Eyes:     General: No scleral icterus.       Right eye: No discharge.        Left eye: No discharge.     Extraocular Movements: Extraocular movements intact.     Conjunctiva/sclera: Conjunctivae normal.     Pupils: Pupils are equal, round, and reactive to light.  Cardiovascular:     Rate and Rhythm: Normal rate and regular rhythm.     Heart sounds: Normal heart sounds. No murmur heard. Pulmonary:     Effort: Pulmonary effort is normal. No respiratory distress.     Breath sounds: Normal breath sounds.  Abdominal:     General: Abdomen is flat. Bowel sounds are normal. There is no distension.     Palpations: Abdomen is soft. There is no mass.     Tenderness: There is no abdominal tenderness (minimal RUQ/LUQ/epigastric discomfort without distention).  Musculoskeletal:        General: No swelling.     Cervical back: Normal range of motion and neck supple. No rigidity or tenderness.  Lymphadenopathy:     Cervical: No cervical adenopathy.  Skin:    General: Skin is warm and dry.     Capillary Refill: Capillary refill takes less than 2 seconds.  Coloration: Skin is not jaundiced.     Findings: No erythema or rash.     Comments: No skin  tenting  Neurological:     Mental Status: She is alert.  Psychiatric:        Mood and Affect: Mood normal.      UC Treatments / Results  Labs (all labs ordered are listed, but only abnormal results are displayed) Labs Reviewed  SARS CORONAVIRUS 2 BY RT PCR  POC INFLUENZA A AND B ANTIGEN (URGENT CARE ONLY)    EKG   Radiology No results found.  Procedures Procedures (including critical care time)  Medications Ordered in UC Medications  ondansetron (ZOFRAN-ODT) disintegrating tablet 4 mg (4 mg Oral Given 01/16/22 1934)    Initial Impression / Assessment and Plan / UC Course  I have reviewed the triage vital signs and the nursing notes.  Pertinent labs & imaging results that were available during my care of the patient were reviewed by me and considered in my medical decision making (see chart for details).     N/V/D -flu test in office negative.  COVID test sent out.  Suspect viral gastroenteritis.  Ondansetron given in office, encouraged oral hydration and brat diet.  ER precautions reviewed.  Patient would be a candidate for antiviral therapy should her COVID test be positive given her history of MS and decreased immune system. ER precautions reviewed.   Final Clinical Impressions(s) / UC Diagnoses   Final diagnoses:  Nausea vomiting and diarrhea     Discharge Instructions      Your flu test was negative. We are still awaiting the COVID test results. They will be available on MyChart once resulted. You were given Zofran in the office.  This should help with the nausea or vomiting.  Have called this in for you to take as well.  Take this every 8-12 hours as needed, you place this medication under the tongue.  Take small sips of water, pedialyte, gatorade until tolerating. Eat BLAND foods - bananas, rice, apples, toast, boiled chicken, saltine crackers. Avoid spicy or greasy foods.   If you develop signs of dehydration such as decreased urination, dry mouth,  or tenting of skin, head to the ER.   ED Prescriptions     Medication Sig Dispense Auth. Provider   ondansetron (ZOFRAN-ODT) 4 MG disintegrating tablet Take 1 tablet (4 mg total) by mouth every 8 (eight) hours as needed for nausea or vomiting. 20 tablet Briahna Pescador L, [place]      PDMP not reviewed this encounter.   Maretta Bees, [place] 01/17/22 1048

## 2022-01-16 NOTE — ED Triage Notes (Signed)
Pt states that she is vomiting, headache, body aches she woke up this morning feeling this way. She did take some Excedrin today. [phone]

## 2022-01-16 NOTE — Discharge Instructions (Addendum)
Your flu test was negative. We are still awaiting the COVID test results. They will be available on MyChart once resulted. You were given Zofran in the office.  This should help with the nausea or vomiting.  Have called this in for you to take as well.  Take this every 8-12 hours as needed, you place this medication under the tongue.  Take small sips of water, pedialyte, gatorade until tolerating. Eat BLAND foods - bananas, rice, apples, toast, boiled chicken, saltine crackers. Avoid spicy or greasy foods.   If you develop signs of dehydration such as decreased urination, dry mouth, or tenting of skin, head to the ER.

## 2022-02-10 ENCOUNTER — Telehealth: Payer: Self-pay | Admitting: *Deleted

## 2022-02-10 NOTE — Telephone Encounter (Signed)
Pt called has question about duration and frequency on her fmla form. Please [phone]

## 2022-02-11 ENCOUNTER — Telehealth: Payer: Self-pay | Admitting: Neurology

## 2022-02-11 ENCOUNTER — Encounter: Payer: Self-pay | Admitting: Neurology

## 2022-02-11 NOTE — Telephone Encounter (Signed)
Pt came in for paperwork to be filled out, wanted to let Dr. Felecia Shelling know that since her infusion 01/20/2022 she has been having neck, shoulder and back pains very bad.

## 2022-02-11 NOTE — Telephone Encounter (Signed)
Carol Berger, I already LVM for her this am and sent mychart back to her. Which she has not read. Can you follow up with her to get payment? I am waiting to hear back from her at this point.

## 2022-02-11 NOTE — Telephone Encounter (Signed)
LVM returning pt call. Asked her to call back.  Previous FMLA filled out for monthly flare ups. Twice a month, up to 2 days/episode or 4 days/month.   If she calls, please clarify if she is needing this updated/changed and what she is looking for specifically.

## 2022-02-12 NOTE — Telephone Encounter (Signed)
Faxed addended form to be 4 times per month, 1 day per episode to [phone]. Received fax confirmation.

## 2022-02-16 ENCOUNTER — Ambulatory Visit: Payer: Managed Care, Other (non HMO) | Admitting: Neurology

## 2022-03-16 ENCOUNTER — Other Ambulatory Visit: Payer: Self-pay

## 2022-03-16 ENCOUNTER — Encounter (HOSPITAL_BASED_OUTPATIENT_CLINIC_OR_DEPARTMENT_OTHER): Payer: Self-pay | Admitting: Emergency Medicine

## 2022-03-16 ENCOUNTER — Emergency Department (HOSPITAL_BASED_OUTPATIENT_CLINIC_OR_DEPARTMENT_OTHER)
Admission: EM | Admit: 2022-03-16 | Discharge: 2022-03-16 | Discharge status: Against Medical Advice | Payer: Managed Care, Other (non HMO) | Attending: Emergency Medicine | Admitting: Emergency Medicine

## 2022-03-16 DIAGNOSIS — M549 Dorsalgia, unspecified: Secondary | ICD-10-CM | POA: Insufficient documentation

## 2022-03-16 DIAGNOSIS — Z5321 Procedure and treatment not carried out due to patient leaving prior to being seen by health care provider: Secondary | ICD-10-CM | POA: Diagnosis not present

## 2022-03-16 LAB — URINALYSIS, ROUTINE W REFLEX MICROSCOPIC
Bilirubin Urine: NEGATIVE
Glucose, UA: NEGATIVE mg/dL
Hgb urine dipstick: NEGATIVE
Ketones, ur: NEGATIVE mg/dL
Leukocytes,Ua: NEGATIVE
Nitrite: NEGATIVE
Protein, ur: NEGATIVE mg/dL
Specific Gravity, Urine: 1.021 (ref 1.005–1.030)
pH: 7 (ref 5.0–8.0)

## 2022-03-16 LAB — PREGNANCY, URINE: Preg Test, Ur: NEGATIVE

## 2022-03-16 NOTE — ED Triage Notes (Signed)
Back pain for weeks Worse tonight. "Its getting  hard to walk" "it almost feels like a spasm" Radiates into hips and down legs

## 2022-06-03 NOTE — Progress Notes (Signed)
Chief Complaint  Patient presents with   Follow-up    Rm EMG/NCV 1. Alone. C/o headaches, stemming from back of neck. C/o neck stiffness. She said the pain occurs every other day. She continues to have left arm numbness and pain, feels it is increasing in the right arm as well.     HISTORY OF PRESENT ILLNESS:  06/04/22 ALL:  Carol Berger is a 32 y.o. female here today for follow up for for RRMS. She continues Ocrevus infusions. Next infusion is 07/21/2022. Labs have been normal. MRI brain and cervical spine stable 02/2021.   She feels that MS symptoms are stable. No new or exacerbating symptoms. Gait is normal. No difficulty with balance.   She continues to have left arm numbness and pain. She feels that her left arm is weaker than the right. Gabapentin was not helpful.   She continue to have headaches fairly regularly. She has 2-3 headaches per week. They are usually bilateral but can be unilateral. Usually start in the back of her head but radiate up to the eye. She describes pain as a sharp/achying/throbbing. No obvious migraine symptoms. She has some dizziness/lightheadedness. She usually takes Excedrin that helps a little bit. No vision changes. She drinks about 3-4 bottles of water daily. No syncope.   Mood is better. She felt that venlafaxine was helping but she was not taking it consistently so stopped. Sertraline was not effective.  Sleep is not great. She works third shift (6p-6a) in the lab. She may get about 6 hours of sleep each night. She usually sleeps soundly for this time frame. She has returned to school at [employer] for [employer].   She continues oxybutynin 5mg  daily for urinary frequency.    HISTORY (copied from Dr previous note)   Carol Berger is a 32 y.o. woman with relapsing remitting multiple sclerosis   UPDATE 12/02/2021: She is on  Ocrevus (next one September 2023).  She has tolerated the infusions well..      No exacerbations.   She denies any new  numbness or weakness.     Gait is fine.  Balance is reduced.   No falls.    She could walk a mile if temperature not elevated but much less if the tem is hot.     She notes numbness and pain in the left arm.  Sometimes the right arm tingle.  She denies weakness.   She has some numbness in the right leg.  She has urinary urgency, and frequency, better with oxybutynin .  BID dries her out though.     She is sleeping worse and also notes more fatigue since starting a different job.  She feels she gets only 5-6 hours sleep some nights.   She has myalgias and muscle spasms.      She is noting some  depression but better than last year    Amitriptyline and sertraline had not helped in past.     She notes trouble coming up with the right words.   She mixes words up at times.         She has needed FMLA due to having more pain.    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      MS HISTORY: In early 2021, she had left-sided visual loss for 1 to 2 weeks that started with pain upon eye movements.  She saw optometry and then Dr. 2022 (  ophthalmology) 08/01/2019 and was referred to the [facility] emergency room.  In the emergency room an MRI of the brain was consistent with optic neuritis and multiple sclerosis and she was admitted.  She received IV Solu-Medrol for total of 5 days.  Vision improved.   She also reported tingling in her feet and hands.  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  The hand numbness seemed to develop over a week.   She had the onset of bilateral foot numbness that started on the right foot and then over a few days also involved the left foot.    This just appeared the one day.   She notes urinary frequency and urgency but no incontinence.  This came on about the same time as her leg numbness in 2020.     She also had MRI cervical spine showing lesions c/w MS.   She was started on Ocrevus  11/09/2019.      IMAGING MRI of the brain and orbits from August 02, 2019:  There is an enhancing lesion within the left optic nerve consistent with optic neuritis.  In the brain, there are multiple T2/FLAIR hyperintense foci in the periventricular, juxtacortical and deep white matter.  5 of these enhance after contrast is administered.  The brainstem and cerebellum appear normal.   MRI of the cervical and thoracic spine August 03, 2019: There are nonenhancing foci consistent with chronic demyelination adjacent to C2-C3 (posterior very slightly left greater than right), adjacent to C3 (ventral midline), adjacent to C6 (central), T6 (central) and T11-T12 (posterior).  There were no significant degenerative changes.   MRI brain 02/18/2021 no new lesions compared to 08/02/2019   MRI cervical spine 02/18/2021 showed T2 hyperintense foci within the spinal cord adjacent to C2-C3, C3, and C5-C6.  None of the foci enhance or appear to be acute.  These are consistent with chronic demyelinating plaque associated with multiple sclerosis.      Small disc osteophyte complex to the right at C3-C4.  No nerve root compression.  Degenerative changes are not noted at other cervical levels.    LABS Pertinent lab work 08/02/2019: ESR, CRP, anti-Jo 1 were normal or negative.  The anti-double-stranded DNA antibodies were slightly positive.  Borderline anemia (he hemoglobin 11.8), normal liver function tests, normal lymphocyte count   REVIEW OF SYSTEMS: Out of a complete 14 system review of symptoms, the patient complains only of the following symptoms, dysesthesias, muscle cramps, headaches, insomnia, fatigue and all other reviewed systems are negative.   ALLERGIES: No Known Allergies   HOME MEDICATIONS: Outpatient Medications Prior to Visit  Medication Sig Dispense Refill   norgestrel-ethinyl estradiol (CRYSELLE-28) 0.3-30 MG-MCG tablet Take 1 tablet by mouth daily.     ocrelizumab (OCREVUS) 300 MG/10ML injection  Inject into the vein once.     oxybutynin (DITROPAN) 5 MG tablet Take 1 tablet (5 mg total) by mouth daily. For bladder 90 tablet 3   ondansetron (ZOFRAN-ODT) 4 MG disintegrating tablet Take 1 tablet (4 mg total) by mouth every 8 (eight) hours as needed for nausea or vomiting. 20 tablet 0   venlafaxine XR (EFFEXOR XR) 75 MG 24 hr capsule Take 1 capsule (75 mg total) by mouth daily with breakfast. 90 capsule 3   No facility-administered medications prior to visit.     PAST MEDICAL HISTORY: Past Medical History:  Diagnosis Date   Anxiety    Depression    Multiple sclerosis ([place])  PAST SURGICAL HISTORY: Past Surgical History:  Procedure Laterality Date   WISDOM TOOTH EXTRACTION     x2     FAMILY HISTORY: History reviewed. No pertinent family history.   SOCIAL HISTORY: Social History   Socioeconomic History   Marital status: Single    Spouse name: Not on file   Number of children: 0   Years of education: some college   Highest education level: Not on file  Occupational History   Occupation: [employer]  Tobacco Use   Smoking status: Never   Smokeless tobacco: Never  Vaping Use   Vaping Use: Never used  Substance and Sexual Activity   Alcohol use: Yes    Comment: socially   Drug use: Yes    Types: Marijuana   Sexual activity: Yes    Birth control/protection: Pill, Condom  Other Topics Concern   Not on file  Social History Narrative   Right handed    Social Determinants of Health   Financial Resource Strain: Not on file  Food Insecurity: Not on file  Transportation Needs: Not on file  Physical Activity: Not on file  Stress: Not on file  Social Connections: Not on file  Intimate Partner Violence: Not on file      PHYSICAL EXAM  Vitals:   06/04/22 1417  BP: 105/75  Pulse: 86  Weight: 209 lb 8 oz (95 kg)  Height: 5\' 6"  (1.676 m)     Body mass index is 33.81 kg/m.   Generalized: Well developed, in no acute distress  Cardiology: normal rate  and rhythm, no murmur auscultated  Respiratory: clear to auscultation bilaterally    Neurological examination  Mentation: Alert oriented to time, place, history taking. Follows all commands speech and language fluent Cranial nerve II-XII: Pupils were equal round reactive to light. Extraocular movements were full, visual field were full on confrontational test. Facial sensation and strength were normal. Head turning and shoulder shrug  were normal and symmetric. Motor: The motor testing reveals 5 over 5 strength of all 4 extremities. Good symmetric motor tone is noted throughout.  Sensory: Sensory testing is intact to soft touch on all 4 extremities. She reports decreased pinprick and tempeture of entire right arm and hand. No evidence of extinction is noted.  Coordination: Cerebellar testing reveals good finger-nose-finger and heel-to-shin bilaterally.  Gait and station: Gait is normal.       DIAGNOSTIC DATA (LABS, IMAGING, TESTING) - I reviewed patient records, labs, notes, testing and imaging myself where available.  Lab Results  Component Value Date   WBC 4.1 12/02/2021   HGB 12.5 12/02/2021   HCT 37.7 12/02/2021   MCV 89 12/02/2021   PLT 188 12/02/2021      Component Value Date/Time   NA 138 08/03/2019 0353   K 4.2 08/03/2019 0353   CL 106 08/03/2019 0353   CO2 22 08/03/2019 0353   GLUCOSE 122 (H) 08/03/2019 0353   BUN 11 08/03/2019 0353   CREATININE 0.90 08/03/2019 0353   CALCIUM 9.8 08/03/2019 0353   PROT 6.8 04/29/2020 1433   ALBUMIN 4.3 04/29/2020 1433   AST 18 04/29/2020 1433   ALT 15 04/29/2020 1433   ALKPHOS 60 04/29/2020 1433   BILITOT <0.2 04/29/2020 1433   GFRNONAA >60 08/03/2019 0353   GFRAA >60 08/03/2019 0353   No results found for: "CHOL", "HDL", "LDLCALC", "LDLDIRECT", "TRIG", "CHOLHDL" No results found for: "HGBA1C" No results found for: "VITAMINB12" No results found for: "TSH"      No data to  display               No data to display            ASSESSMENT AND PLAN  32 y.o. year old female  has a past medical history of Anxiety, Depression, and Multiple sclerosis (HCC). here with   Relapsing remitting multiple sclerosis (HCC) - Plan: CBC with Differential/Platelets, IgG, IgA, IgM  High risk medication use  Anxiety and depression  Dysesthesia  Other fatigue  Armanda is doing fairly well, today with no new or exacerbating symptoms. We will update labs, today. She is having more headaches, continues to have dysesthesias of left arm and feels mood was better on venlafaxine. I will have her restart venlafaxine 75mg  daily. I have encouraged her to reach out to me with progress report in 6-8 weeks of consistent use. She will continue oxybutynin 5mg  1-2 times daily for urinary frequency. Healthy lifestyle habits encouraged. She will follow up in 6 months with Dr .    Orders Placed This Encounter  Procedures   CBC with Differential/Platelets   IgG, IgA, IgM     Meds ordered this encounter  Medications   venlafaxine XR (EFFEXOR XR) 75 MG 24 hr capsule    Sig: Take 1 capsule (75 mg total) by mouth daily.    Dispense:  90 capsule    Refill:  3    Order Specific Question:   Supervising Provider    Answer:   Epimenio Foot    Anson Fret, MSN, FNP-C 06/04/2022, 3:14 PM  [facility] [address] [place], [address] [place] [phone]

## 2022-06-03 NOTE — Patient Instructions (Signed)
Below is our plan:  We will continue current treatment plan. Restart venlafaxine 75mg  daily. Take consistently for 2 months and let me know how you are doing.   Please make sure you are staying well hydrated. I recommend 50-60 ounces daily. Well balanced diet and regular exercise encouraged. Consistent sleep schedule with 6-8 hours recommended.   Please continue follow up with care team as directed.   Follow up with Dr Felecia Shelling in 6 months   You may receive a survey regarding today's visit. I encourage you to leave honest feed back as I do use this information to improve patient care. Thank you for seeing me today!

## 2022-06-04 ENCOUNTER — Ambulatory Visit (INDEPENDENT_AMBULATORY_CARE_PROVIDER_SITE_OTHER): Payer: Managed Care, Other (non HMO) | Admitting: Family Medicine

## 2022-06-04 ENCOUNTER — Encounter: Payer: Self-pay | Admitting: Family Medicine

## 2022-06-04 VITALS — BP 105/75 | HR 86 | Ht 66.0 in | Wt 209.5 lb

## 2022-06-04 DIAGNOSIS — F419 Anxiety disorder, unspecified: Secondary | ICD-10-CM | POA: Diagnosis not present

## 2022-06-04 DIAGNOSIS — R208 Other disturbances of skin sensation: Secondary | ICD-10-CM

## 2022-06-04 DIAGNOSIS — Z79899 Other long term (current) drug therapy: Secondary | ICD-10-CM

## 2022-06-04 DIAGNOSIS — G35 Multiple sclerosis: Secondary | ICD-10-CM

## 2022-06-04 DIAGNOSIS — R5383 Other fatigue: Secondary | ICD-10-CM

## 2022-06-04 DIAGNOSIS — F32A Depression, unspecified: Secondary | ICD-10-CM

## 2022-06-04 MED ORDER — VENLAFAXINE HCL ER 75 MG PO CP24: 75.0000 mg | ORAL_CAPSULE | Freq: Every day | ORAL | 3 refills | Status: DC

## 2022-06-05 LAB — CBC WITH DIFFERENTIAL/PLATELET
Basophils Absolute: 0 10*3/uL (ref 0.0–0.2)
Basos: 0 %
EOS (ABSOLUTE): 0.1 10*3/uL (ref 0.0–0.4)
Eos: 2 %
Hematocrit: 38.7 % (ref 34.0–46.6)
Hemoglobin: 12.6 g/dL (ref 11.1–15.9)
Immature Grans (Abs): 0 10*3/uL (ref 0.0–0.1)
Immature Granulocytes: 0 %
Lymphocytes Absolute: 1.2 10*3/uL (ref 0.7–3.1)
Lymphs: 25 %
MCH: 29 pg (ref 26.6–33.0)
MCHC: 32.6 g/dL (ref 31.5–35.7)
MCV: 89 fL (ref 79–97)
Monocytes Absolute: 0.6 10*3/uL (ref 0.1–0.9)
Monocytes: 13 %
Neutrophils Absolute: 2.7 10*3/uL (ref 1.4–7.0)
Neutrophils: 60 %
Platelets: 213 10*3/uL (ref 150–450)
RBC: 4.35 x10E6/uL (ref 3.77–5.28)
RDW: 12.5 % (ref 11.7–15.4)
WBC: 4.6 10*3/uL (ref 3.4–10.8)

## 2022-06-05 LAB — IGG, IGA, IGM
IgA/Immunoglobulin A, Serum: 136 mg/dL (ref 87–352)
IgG (Immunoglobin G), Serum: 1186 mg/dL (ref 586–1602)
IgM (Immunoglobulin M), Srm: 102 mg/dL (ref 26–217)

## 2022-08-02 ENCOUNTER — Encounter: Payer: Self-pay | Admitting: Family Medicine

## 2022-08-04 NOTE — Telephone Encounter (Signed)
Tried calling pt at [phone]. Went straight to VM. LVM for her to call office.

## 2022-08-04 NOTE — Telephone Encounter (Signed)
Carol Berger   pt called back in phone room. She was in class at the time and could not talk, she said her eye is fine now, she removed her contacts. She was just nervous due to her history of Optic neuritis. I did relay your message to her as well

## 2022-08-27 ENCOUNTER — Telehealth: Payer: Self-pay | Admitting: *Deleted

## 2022-08-27 NOTE — Telephone Encounter (Signed)
Faxed completed/signed form below to [employer] at [phone]. Received fax confirmation. Also gave copy to [employer].   

## 2022-09-10 ENCOUNTER — Telehealth: Payer: Self-pay | Admitting: *Deleted

## 2022-09-10 DIAGNOSIS — Z0289 Encounter for other administrative examinations: Secondary | ICD-10-CM

## 2022-09-10 NOTE — Telephone Encounter (Signed)
Gave completed/signed [employer] provider certification of medical leave to medical records to process for pt.

## 2022-11-06 ENCOUNTER — Telehealth: Payer: Self-pay | Admitting: Family Medicine

## 2022-11-06 NOTE — Telephone Encounter (Signed)
Pr requesting a refill on oxybutynin (DITROPAN) 5 MG tablet. Refill should be sent to [facility] [phone]

## 2022-11-09 MED ORDER — OXYBUTYNIN CHLORIDE 5 MG PO TABS: 5.0000 mg | ORAL_TABLET | Freq: Every day | ORAL | 2 refills | Status: DC

## 2022-11-09 NOTE — Telephone Encounter (Signed)
E-scribed refill 

## 2022-12-03 ENCOUNTER — Telehealth: Payer: Self-pay | Admitting: Neurology

## 2022-12-03 ENCOUNTER — Encounter: Payer: Self-pay | Admitting: Neurology

## 2022-12-03 ENCOUNTER — Ambulatory Visit (INDEPENDENT_AMBULATORY_CARE_PROVIDER_SITE_OTHER): Payer: Managed Care, Other (non HMO) | Admitting: Neurology

## 2022-12-03 VITALS — BP 116/74 | HR 69 | Wt 214.5 lb

## 2022-12-03 DIAGNOSIS — G35 Multiple sclerosis: Secondary | ICD-10-CM | POA: Diagnosis not present

## 2022-12-03 DIAGNOSIS — R208 Other disturbances of skin sensation: Secondary | ICD-10-CM | POA: Diagnosis not present

## 2022-12-03 DIAGNOSIS — R5383 Other fatigue: Secondary | ICD-10-CM | POA: Diagnosis not present

## 2022-12-03 DIAGNOSIS — G35A Relapsing-remitting multiple sclerosis: Secondary | ICD-10-CM

## 2022-12-03 DIAGNOSIS — R3915 Urgency of urination: Secondary | ICD-10-CM

## 2022-12-03 DIAGNOSIS — R269 Unspecified abnormalities of gait and mobility: Secondary | ICD-10-CM | POA: Diagnosis not present

## 2022-12-03 DIAGNOSIS — F419 Anxiety disorder, unspecified: Secondary | ICD-10-CM

## 2022-12-03 DIAGNOSIS — F32A Depression, unspecified: Secondary | ICD-10-CM

## 2022-12-03 DIAGNOSIS — H469 Unspecified optic neuritis: Secondary | ICD-10-CM

## 2022-12-03 MED ORDER — TRAZODONE HCL 50 MG PO TABS: 50.0000 mg | ORAL_TABLET | Freq: Every day | ORAL | 3 refills | Status: DC

## 2022-12-03 NOTE — Telephone Encounter (Signed)
sent to [facility] they obtain [employer] auth [phone] 

## 2022-12-03 NOTE — Progress Notes (Signed)
[facility]  PATIENT: Carol Berger DOB: 1990/09/17  REFERRING DOCTOR OR PCP: Dr. Kerry Hough SOURCE: Patient, recent hospitalization notes, imaging and lab reports, MRI images personally reviewed.  _________________________________   HISTORICAL  CHIEF COMPLAINT:  Chief Complaint  Patient presents with   Room 10    Pt is here Alone. Pt states that she has been having a lot of Fatigue. Pt states that she is still having back and shoulder pain. Pt states that her left arm is still weak.     HISTORY OF PRESENT ILLNESS:   Carol Berger is a 32 y.o. woman with relapsing remitting multiple sclerosis  UPDATE 12/03/2022: She is on  Ocrevus (next one September 2024).  She has tolerated the infusions well..     No exacerbations but she has some symptoms  Gait is fine.  Balance is reduced.   No falls.    She will use a bannister on stairs.  She could walk a mile in good weather.    She notes numbness and pain in the left arm.  Sometimes the right arm tingle though left is worse.  She denies weakness.     She has urinary urgency, and frequency, better with oxybutynin.   She has sleep onset > maintenance insomnia.   She also notes fatigue, worse than last year. THis is much worse with heat.    She has myalgias and muscle spasms.     She feels venlafaxine helps mood.    She still has headaches.   Amitriptyline and sertraline had not helped in past.     She notes trouble coming up with the right words.   She mixes words up at times.        She has needed FMLA.    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     MS HISTORY: In early 2021, she had left-sided visual loss for 1 to 2 weeks that started with pain upon eye movements.  She saw optometry and then Dr. Sherryll Burger (ophthalmology) 08/01/2019 and was referred to the [facility] emergency room.  In the emergency room an MRI of the brain was consistent with optic neuritis and multiple  sclerosis and she was admitted.  She received IV Solu-Medrol for total of 5 days.  Vision improved.   She also reported tingling in her feet and hands.  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  The hand numbness seemed to develop over a week.   She had the onset of bilateral foot numbness that started on the right foot and then over a few days also involved the left foot.    This just appeared the one day.   She notes urinary frequency and urgency but no incontinence.  This came on about the same time as her leg numbness in 2020.     She also had MRI cervical spine showing lesions c/w MS.   She was started on Ocrevus 11/09/2019.      IMAGING MRI of the brain and orbits from August 02, 2019:  There is an enhancing lesion within the left optic nerve consistent with optic neuritis.  In the brain, there are multiple T2/FLAIR hyperintense foci in the periventricular, juxtacortical and deep white matter.  5 of these enhance after contrast is administered.  The brainstem and cerebellum appear normal.  MRI of the cervical and thoracic spine August 03, 2019:  There are nonenhancing foci consistent with chronic demyelination adjacent to C2-C3 (posterior very slightly left greater than right), adjacent to C3 (ventral midline), adjacent to C6 (central), T6 (central) and T11-T12 (posterior).  There were no significant degenerative changes.  MRI brain 02/18/2021 no new lesions compared to 08/02/2019  MRI cervical spine 02/18/2021 showed T2 hyperintense foci within the spinal cord adjacent to C2-C3, C3, and C5-C6.  None of the foci enhance or appear to be acute.  These are consistent with chronic demyelinating plaque associated with multiple sclerosis.      Small disc osteophyte complex to the right at C3-C4.  No nerve root compression.  Degenerative changes are not noted at other cervical levels.   LABS Pertinent lab work 08/02/2019: ESR, CRP, anti-Jo 1 were normal or  negative.  The anti-double-stranded DNA antibodies were slightly positive.  Borderline anemia (he hemoglobin 11.8), normal liver function tests, normal lymphocyte count  REVIEW OF SYSTEMS: Constitutional: No fevers, chills, sweats, or change in appetite Eyes: No visual changes, double vision, eye pain Ear, nose and throat: No hearing loss, ear pain, nasal congestion, sore throat Cardiovascular: No chest pain, palpitations Respiratory:  No shortness of breath at rest or with exertion.   No wheezes GastrointestinaI: No nausea, vomiting, diarrhea, abdominal pain, fecal incontinence Genitourinary:  No dysuria, urinary retention or frequency.  No nocturia. Musculoskeletal:  No neck pain, back pain Integumentary: No rash, pruritus, skin lesions Neurological: as above Psychiatric: No depression at this time.  No anxiety Endocrine: No palpitations, diaphoresis, change in appetite, change in weigh or increased thirst Hematologic/Lymphatic:  No anemia, purpura, petechiae. Allergic/Immunologic: No itchy/runny eyes, nasal congestion, recent allergic reactions, rashes  ALLERGIES: No Known Allergies  HOME MEDICATIONS:  Current Outpatient Medications:    norgestrel-ethinyl estradiol (CRYSELLE-28) 0.3-30 MG-MCG tablet, Take 1 tablet by mouth daily., Disp: , Rfl:    ocrelizumab (OCREVUS) 300 MG/10ML injection, Inject into the vein once., Disp: , Rfl:    oxybutynin (DITROPAN) 5 MG tablet, Take 1 tablet (5 mg total) by mouth daily. For bladder, Disp: 90 tablet, Rfl: 2   venlafaxine XR (EFFEXOR XR) 75 MG 24 hr capsule, Take 1 capsule (75 mg total) by mouth daily., Disp: 90 capsule, Rfl: 3  PAST MEDICAL HISTORY: Past Medical History:  Diagnosis Date   Anxiety    Depression    Multiple sclerosis (HCC)     PAST SURGICAL HISTORY: Past Surgical History:  Procedure Laterality Date   WISDOM TOOTH EXTRACTION     x2    FAMILY HISTORY: History reviewed. No pertinent family history.  SOCIAL  HISTORY:  Social History   Socioeconomic History   Marital status: Single    Spouse name: Not on file   Number of children: 0   Years of education: some college   Highest education level: Not on file  Occupational History   Occupation: [employer]  Tobacco Use   Smoking status: Never   Smokeless tobacco: Never  Vaping Use   Vaping status: Never Used  Substance and Sexual Activity   Alcohol use: Yes    Comment: socially   Drug use: Yes    Types: Marijuana   Sexual activity: Yes    Birth control/protection: Pill, Condom  Other Topics Concern   Not on file  Social History Narrative   Right handed    Social Determinants of Health   Financial Resource Strain: Not on file  Food Insecurity: Not on file  Transportation Needs: Not on file  Physical Activity: Not on file  Stress: Not on file  Social Connections: Unknown (09/15/2021)   Received from [facility], [facility]   Social Network    Social Network: Not on file  Intimate Partner Violence: Unknown (08/22/2021)   Received from [facility], [facility]   HITS    Physically Hurt: Not on file    Insult or Talk Down To: Not on file    Threaten Physical Harm: Not on file    Scream or Curse: Not on file     PHYSICAL EXAM  Vitals:   12/03/22 1520  BP: 116/74  Pulse: 69  Weight: 214 lb 8 oz (97.3 kg)    Body mass index is 34.62 kg/m.   General: The patient is well-developed and well-nourished and in no acute distress.  Neck tenderness  HEENT:  Head is [place]/AT.   Skin: Extremities are without rash or  edema.  Neurologic Exam  Mental status: The patient is alert and oriented x 3 at the time of the examination. The patient has apparent normal recent and remote memory, with an apparently normal attention span and concentration ability.   Speech is normal.  Cranial nerves: Extraocular movements are full.  Mild reduced color vision OS.   Facial symmetry is present. There is good facial sensation to soft touch  bilaterally.Facial strength is normal.  Trapezius and sternocleidomastoid strength is normal. No dysarthria is noted.   No obvious hearing deficits are noted.  Motor:  Muscle bulk is normal.   Tone is normal. Strength is  5 / 5 in all 4 extremities.   Sensory: Sensory testing is intact to touch, temperature and vibration in arms but reduced left leg.    Coordination: Cerebellar testing reveals good finger-nose-finger and heel-to-shin bilaterally.  Gait and station: Station is normal.   Gait is normal. Tandem gait is mildly wide. Romberg is negative.   Reflexes: Deep tendon reflexes are symmetric and 2 in the arms, 3 at knees and ankles      DIAGNOSTIC DATA (LABS, IMAGING, TESTING) - I reviewed patient records, labs, notes, testing and imaging myself where available.  Lab Results  Component Value Date   WBC 4.6 06/04/2022   HGB 12.6 06/04/2022   HCT 38.7 06/04/2022   MCV 89 06/04/2022   PLT 213 06/04/2022      Component Value Date/Time   NA 138 08/03/2019 0353   K 4.2 08/03/2019 0353   CL 106 08/03/2019 0353   CO2 22 08/03/2019 0353   GLUCOSE 122 (H) 08/03/2019 0353   BUN 11 08/03/2019 0353   CREATININE 0.90 08/03/2019 0353   CALCIUM 9.8 08/03/2019 0353   PROT 6.8 04/29/2020 1433   ALBUMIN 4.3 04/29/2020 1433   AST 18 04/29/2020 1433   ALT 15 04/29/2020 1433   ALKPHOS 60 04/29/2020 1433   BILITOT <0.2 04/29/2020 1433   GFRNONAA >60 08/03/2019 0353   GFRAA >60 08/03/2019 0353       ASSESSMENT AND PLAN  Relapsing remitting multiple sclerosis (HCC)  Gait disorder  Dysesthesia  Other fatigue  Anxiety and depression  Urinary urgency  Optic neuritis, left  1.   Continue Ocrevus.   Labs were fine earlier this year - will need IgG/IgM and CBC/D next visit.   We will check MRI to determine if any subclinical progression.  If this is occurring, consider a different DMT.    2.   Continue Oxybutynin for bladder  3.   Continue Effexor for mood 4.   Adjust FMLA -up  to 2 episodes  per month with 1 or 2 days per episode or 4 days/month 6.   rtc 6 months or sooner if new or worsening neuologic function   Keyonia Gluth A. Epimenio Foot, MD, [facility] 12/03/2022, 3:52 PM Certified in Neurology, Clinical Neurophysiology, Sleep Medicine and Neuroimaging  [facility] [address], [address] [place], [place] [address] [phone]

## 2023-01-23 ENCOUNTER — Ambulatory Visit
Admission: RE | Admit: 2023-01-23 | Discharge: 2023-01-23 | Disposition: A | Discharge status: Home / Self Care | Payer: Managed Care, Other (non HMO) | Source: Ambulatory Visit | Attending: Neurology | Admitting: Neurology

## 2023-01-23 DIAGNOSIS — R269 Unspecified abnormalities of gait and mobility: Secondary | ICD-10-CM

## 2023-01-23 DIAGNOSIS — G35 Multiple sclerosis: Secondary | ICD-10-CM | POA: Diagnosis not present

## 2023-01-23 MED ORDER — GADOPICLENOL 0.5 MMOL/ML IV SOLN
10.0000 mL | Freq: Once | INTRAVENOUS | Status: AC | PRN
  Administered 2023-01-23: 10 mL via INTRAVENOUS

## 2023-02-16 ENCOUNTER — Emergency Department (HOSPITAL_BASED_OUTPATIENT_CLINIC_OR_DEPARTMENT_OTHER)
Admission: EM | Admit: 2023-02-16 | Discharge: 2023-02-16 | Disposition: A | Discharge status: Home / Self Care | Payer: Managed Care, Other (non HMO) | Attending: Emergency Medicine | Admitting: Emergency Medicine

## 2023-02-16 ENCOUNTER — Encounter (HOSPITAL_BASED_OUTPATIENT_CLINIC_OR_DEPARTMENT_OTHER): Payer: Self-pay | Admitting: Emergency Medicine

## 2023-02-16 ENCOUNTER — Emergency Department (HOSPITAL_BASED_OUTPATIENT_CLINIC_OR_DEPARTMENT_OTHER): Payer: Managed Care, Other (non HMO) | Admitting: Radiology

## 2023-02-16 DIAGNOSIS — R1030 Lower abdominal pain, unspecified: Secondary | ICD-10-CM | POA: Diagnosis present

## 2023-02-16 DIAGNOSIS — N3000 Acute cystitis without hematuria: Secondary | ICD-10-CM | POA: Diagnosis not present

## 2023-02-16 DIAGNOSIS — K5900 Constipation, unspecified: Secondary | ICD-10-CM | POA: Diagnosis not present

## 2023-02-16 LAB — URINALYSIS, ROUTINE W REFLEX MICROSCOPIC
Bilirubin Urine: NEGATIVE
Glucose, UA: NEGATIVE mg/dL
Ketones, ur: 40 mg/dL — AB
Leukocytes,Ua: NEGATIVE
Nitrite: POSITIVE — AB
Protein, ur: 30 mg/dL — AB
Specific Gravity, Urine: 1.039 — ABNORMAL HIGH (ref 1.005–1.030)
pH: 6 (ref 5.0–8.0)

## 2023-02-16 LAB — COMPREHENSIVE METABOLIC PANEL
ALT: 11 U/L (ref 0–44)
AST: 15 U/L (ref 15–41)
Albumin: 4.3 g/dL (ref 3.5–5.0)
Alkaline Phosphatase: 37 U/L — ABNORMAL LOW (ref 38–126)
Anion gap: 10 (ref 5–15)
BUN: 10 mg/dL (ref 6–20)
CO2: 24 mmol/L (ref 22–32)
Calcium: 9.4 mg/dL (ref 8.9–10.3)
Chloride: 104 mmol/L (ref 98–111)
Creatinine, Ser: 0.78 mg/dL (ref 0.44–1.00)
GFR, Estimated: >60 mL/min (ref >60)
Glucose, Bld: 89 mg/dL (ref 70–99)
Potassium: 3.8 mmol/L (ref 3.5–5.1)
Sodium: 138 mmol/L (ref 135–145)
Total Bilirubin: 0.4 mg/dL (ref 0.3–1.2)
Total Protein: 7.4 g/dL (ref 6.5–8.1)

## 2023-02-16 LAB — PREGNANCY, URINE: Preg Test, Ur: NEGATIVE

## 2023-02-16 LAB — CBC
HCT: 38.1 % (ref 36.0–46.0)
Hemoglobin: 12.7 g/dL (ref 12.0–15.0)
MCH: 29.1 pg (ref 26.0–34.0)
MCHC: 33.3 g/dL (ref 30.0–36.0)
MCV: 87.4 fL (ref 80.0–100.0)
Platelets: 210 10*3/uL (ref 150–400)
RBC: 4.36 MIL/uL (ref 3.87–5.11)
RDW: 13.4 % (ref 11.5–15.5)
WBC: 8.5 10*3/uL (ref 4.0–10.5)
nRBC: 0 % (ref 0.0–0.2)

## 2023-02-16 LAB — LIPASE, BLOOD: Lipase: 10 U/L — ABNORMAL LOW (ref 11–51)

## 2023-02-16 MED ORDER — CEPHALEXIN 500 MG PO CAPS: 500.0000 mg | ORAL_CAPSULE | Freq: Two times a day (BID) | ORAL | 0 refills | Status: AC

## 2023-02-16 MED ORDER — POLYETHYLENE GLYCOL 3350 17 G PO PACK: 17.0000 g | PACK | Freq: Every day | ORAL | 0 refills | Status: AC

## 2023-02-16 MED ORDER — CEPHALEXIN 250 MG PO CAPS
500.0000 mg | ORAL_CAPSULE | Freq: Once | ORAL | Status: AC
  Administered 2023-02-16: 500 mg via ORAL
  Filled 2023-02-16: qty 2

## 2023-02-16 MED ORDER — KETOROLAC TROMETHAMINE 15 MG/ML IJ SOLN: 15.0000 mg | Freq: Once | INTRAMUSCULAR | Status: DC

## 2023-02-16 MED ORDER — IBUPROFEN 800 MG PO TABS
800.0000 mg | ORAL_TABLET | Freq: Once | ORAL | Status: AC
  Administered 2023-02-16: 800 mg via ORAL
  Filled 2023-02-16: qty 1

## 2023-02-16 NOTE — Discharge Instructions (Signed)
Today you are found to have a urinary tract infection, does not appear that you are retaining urine or stool.  Return to the ER if you have any increased weakness, difficulty having a bowel movement, or difficulty urinating.  Take the medication as prescribed.  Increase your water intake, and exercise to help with constipation

## 2023-02-16 NOTE — ED Notes (Signed)
RN reviewed discharge instructions with pt. Pt verbalized understanding and had no further questions

## 2023-02-16 NOTE — ED Triage Notes (Signed)
Sharp pain suprapubic area.  Constant Frequent urination diarrhea

## 2023-02-16 NOTE — ED Provider Notes (Signed)
[place] EMERGENCY DEPARTMENT AT [facility] Provider Note   CSN: [id number] Arrival date & time: 02/16/23  2018     History  Chief Complaint  Patient presents with   Abdominal Pain    Joretta Eads is a 32 y.o. female,  history of MS, who presents to the ED secondary to sharp stabbing pain in her suprapubic area that is been going on for the last day.  She reports frequent urination, with complete evacuation, but she feels like she has a stool and is unable to.  She has not had a bowel movement, and completion for the last couple days, she states she is having difficult time passing gas.  Denies any nausea, vomiting.  No history of abdominal surgeries    Home Medications Prior to Admission medications   Medication Sig Start Date End Date Taking? Authorizing Provider  cephALEXin (KEFLEX) 500 MG capsule Take 1 capsule (500 mg total) by mouth 2 (two) times daily. 02/16/23  Yes Itzelle Gains L, PA  polyethylene glycol (MIRALAX) 17 g packet Take 17 g by mouth daily. 02/16/23  Yes Robynn Marcel L, PA  norgestrel-ethinyl estradiol (CRYSELLE-28) 0.3-30 MG-MCG tablet Take 1 tablet by mouth daily.    [provider]  ocrelizumab (OCREVUS) 300 MG/10ML injection Inject into the vein once.    [provider]  oxybutynin (DITROPAN) 5 MG tablet Take 1 tablet (5 mg total) by mouth daily. For bladder 11/09/22   Sater, Pearletha Furl, MD  traZODone (DESYREL) 50 MG tablet Take 1 tablet (50 mg total) by mouth at bedtime. 12/03/22   Sater, Pearletha Furl, MD  venlafaxine XR (EFFEXOR XR) 75 MG 24 hr capsule Take 1 capsule (75 mg total) by mouth daily. 06/04/22   Shawnie Dapper, NP      Allergies    Patient has no known allergies.    Review of Systems   Review of Systems  Gastrointestinal:  Positive for abdominal pain and nausea. Negative for vomiting.    Physical Exam Updated Vital Signs BP 121/72 (BP Location: Right Arm)   Pulse 77   Temp 98.1 F (36.7 C)   Resp 18   SpO2 99%   Physical Exam Vitals and nursing note reviewed.  Constitutional:      General: She is not in acute distress.    Appearance: She is well-developed.  HENT:     Head: Normocephalic and atraumatic.  Eyes:     Conjunctiva/sclera: Conjunctivae normal.  Cardiovascular:     Rate and Rhythm: Normal rate and regular rhythm.     Heart sounds: No murmur heard. Pulmonary:     Effort: Pulmonary effort is normal. No respiratory distress.     Breath sounds: Normal breath sounds.  Abdominal:     Palpations: Abdomen is soft.     Tenderness: There is abdominal tenderness in the suprapubic area. There is no guarding.  Genitourinary:    Rectum: Normal. No mass.  Musculoskeletal:        General: No swelling.     Cervical back: Neck supple.  Skin:    General: Skin is warm and dry.     Capillary Refill: Capillary refill takes less than 2 seconds.  Neurological:     Mental Status: She is alert.  Psychiatric:        Mood and Affect: Mood normal.     ED Results / Procedures / Treatments   Labs (all labs ordered are listed, but only abnormal results are displayed) Labs Reviewed  LIPASE, BLOOD -  Abnormal; Notable for the following components:      Result Value   Lipase 10 (*)    All other components within normal limits  COMPREHENSIVE METABOLIC PANEL - Abnormal; Notable for the following components:   Alkaline Phosphatase 37 (*)    All other components within normal limits  URINALYSIS, ROUTINE W REFLEX MICROSCOPIC - Abnormal; Notable for the following components:   Specific Gravity, Urine 1.039 (*)    Hgb urine dipstick Afifa Truax (*)    Ketones, ur 40 (*)    Protein, ur 30 (*)    Nitrite POSITIVE (*)    Bacteria, UA FEW (*)    All other components within normal limits  CBC  PREGNANCY, URINE    EKG None  Radiology DG Abdomen 1 View  Result Date: 02/16/2023 CLINICAL DATA:  Abdominal pain EXAM: ABDOMEN - 1 VIEW COMPARISON:  None Available. FINDINGS: Scattered large and Cruz Bong bowel gas is  noted. No free air is seen. No mass lesion is noted. No bony abnormality is seen. IMPRESSION: No acute abnormality noted. Electronically Signed   By: Alcide Clever M.D.   On: 02/16/2023 23:40    Procedures Procedures    Medications Ordered in ED Medications  ibuprofen (ADVIL) tablet 800 mg (800 mg Oral Given 02/16/23 2324)  cephALEXin (KEFLEX) capsule 500 mg (500 mg Oral Given 02/16/23 2351)    ED Course/ Medical Decision Making/ A&P                                 Medical Decision Making Patient is a 32 year old female, here for her sharp suprapubic pain that is been going on for about a day, as well as severe constipation, has been going on for couple days.  Does not passing gas.  She does not have a distended abdomen, but she does have severe stabbing pain to the suprapubic area, we will obtain a urinalysis, and bladder scan as well as KUB for further evaluation.  She does have MS so I did do a rectal exam, and she had does have good rectal tone, no evidence of any kind of mass, or fecal impaction.  Additionally the nurses let me know that patient has a large amount of urine in her bladder, they were unable to quantify exactly how much urine, we placed a catheter, with very little output, only about 30 to 40 mL, it was taken out, as there was little relief, and there was only 40 mL.  Initially I was told that there was a large amount around 2-300, and that is why the catheter was placed.  Amount and/or Complexity of Data Reviewed Labs: ordered.    Details: Nitrate positive urinary tract infection Radiology: ordered.    Details: KUB shows no obstruction Discussion of management or test interpretation with external provider(s):  patient with a good rectal tone, KUB did not show any obstruction, possible constipation, causing irritation to the bladder wall.  Additionally had a negative positive urinary tract infection.  Placed a catheter due to concern for large amount of retention per nursing  staff bladder scan.  Found to not have a large amount of urine in her bladder, after a catheter placed, catheter discharge, given lack of retention.  Treated for urinary tract infection, and constipation.  Risk OTC drugs. Prescription drug management.    Final Clinical Impression(s) / ED Diagnoses Final diagnoses:  Acute cystitis without hematuria  Constipation, unspecified constipation type  Rx / DC Orders ED Discharge Orders          Ordered    cephALEXin (KEFLEX) 500 MG capsule  2 times daily        02/16/23 2349    polyethylene glycol (MIRALAX) 17 g packet  Daily        02/16/23 2349              Hazem Kenner Elbert Ewings, PA 02/16/23 2354    Ernie Avena, MD 02/17/23 0134

## 2023-03-08 ENCOUNTER — Encounter: Payer: Self-pay | Admitting: Family Medicine

## 2023-03-10 ENCOUNTER — Encounter: Payer: Self-pay | Admitting: Neurology

## 2023-03-10 ENCOUNTER — Other Ambulatory Visit: Payer: Self-pay | Admitting: Neurology

## 2023-03-10 ENCOUNTER — Ambulatory Visit (INDEPENDENT_AMBULATORY_CARE_PROVIDER_SITE_OTHER): Payer: Managed Care, Other (non HMO) | Admitting: Neurology

## 2023-03-10 VITALS — BP 109/67 | HR 76 | Ht 66.0 in | Wt 209.0 lb

## 2023-03-10 DIAGNOSIS — R208 Other disturbances of skin sensation: Secondary | ICD-10-CM | POA: Diagnosis not present

## 2023-03-10 DIAGNOSIS — F32A Depression, unspecified: Secondary | ICD-10-CM

## 2023-03-10 DIAGNOSIS — G35 Multiple sclerosis: Secondary | ICD-10-CM | POA: Diagnosis not present

## 2023-03-10 DIAGNOSIS — F419 Anxiety disorder, unspecified: Secondary | ICD-10-CM

## 2023-03-10 DIAGNOSIS — R3915 Urgency of urination: Secondary | ICD-10-CM

## 2023-03-10 DIAGNOSIS — R899 Unspecified abnormal finding in specimens from other organs, systems and tissues: Secondary | ICD-10-CM

## 2023-03-10 DIAGNOSIS — E559 Vitamin D deficiency, unspecified: Secondary | ICD-10-CM

## 2023-03-10 DIAGNOSIS — R269 Unspecified abnormalities of gait and mobility: Secondary | ICD-10-CM

## 2023-03-10 DIAGNOSIS — Z79899 Other long term (current) drug therapy: Secondary | ICD-10-CM

## 2023-03-10 MED ORDER — VENLAFAXINE HCL ER 75 MG PO CP24: 150.0000 mg | ORAL_CAPSULE | Freq: Every day | ORAL | 3 refills | Status: AC

## 2023-03-10 MED ORDER — TIZANIDINE HCL 2 MG PO TABS: ORAL_TABLET | ORAL | 5 refills | Status: AC

## 2023-03-10 NOTE — Progress Notes (Signed)
[facility]  PATIENT: Carol Berger DOB: October 24, 1990  REFERRING DOCTOR OR PCP: Dr. Kerry Hough SOURCE: Patient, recent hospitalization notes, imaging and lab reports, MRI images personally reviewed.  _________________________________   HISTORICAL  CHIEF COMPLAINT:  Chief Complaint  Patient presents with   Follow-up    Pt in room 11. Here for worsening symptoms. Pt reports not feeling well since Sept. Pt said back and shoulder hurt, back feels like its on fire, needle stabbing pain, migraines to the point of feeling dizzy. Pt works 2 jobs one job is 12 hours shift. Pt was in ED on 02/16/23     HISTORY OF PRESENT ILLNESS:   Carol Berger is a 32 y.o. woman with relapsing remitting multiple sclerosis  UPDATE 03/11/2023: She is on  Ocrevus (last one September 2024).  She has tolerated the infusions well..   She denies exacerbations but is having more pain  She reports constant pain that seems more in her skin at times.  Worse pain is in her back and shoulders.  She feels like someone is sticking needles into her back.  Gabapentin had not helped her in the past.    No exacerbations but she has some symptoms  She reports her gait is ok.  Balance is mildly reduced.   No falls.    She will use a bannister on stairs.   She notes numbness and pain in the left arm.  The right arm sometimes tingles, esp shoulder..  She denies weakness.     She has urinary urgency, and frequency, better with oxybutynin. She had a UTI recently and had retention so a catheter was used just in the ED.    She has sleep onset > maintenance insomnia.   She takes trazodone that helps her sleep but has a hangover effect.   She also notes fatigue, worse than last year. THis is much worse with heat.    She has myalgias and muscle spasms.     She feels venlafaxine helps mood.    She still has headaches.   Amitriptyline and sertraline had not helped in past.     She notes trouble coming up with the right  words.   She mixes words up at times.        She has needed FMLA.    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     MS HISTORY: In early 2021, she had left-sided visual loss for 1 to 2 weeks that started with pain upon eye movements.  She saw optometry and then Dr. Sherryll Burger (ophthalmology) 08/01/2019 and was referred to the [facility] emergency room.  In the emergency room an MRI of the brain was consistent with optic neuritis and multiple sclerosis and she was admitted.  She received IV Solu-Medrol for total of 5 days.  Vision improved.   She also reported tingling in her feet and hands.  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  The hand numbness seemed to develop over a week.   She had the onset of bilateral foot numbness that started on the right foot and then over a few days also involved the left foot.    This just appeared the one day.   She notes urinary frequency and urgency but no incontinence.  This came on about the same time as her leg numbness in 2020.     She also  had MRI cervical spine showing lesions c/w MS.   She was started on Ocrevus 11/09/2019.      IMAGING MRI of the brain and orbits from August 02, 2019:  There is an enhancing lesion within the left optic nerve consistent with optic neuritis.  In the brain, there are multiple T2/FLAIR hyperintense foci in the periventricular, juxtacortical and deep white matter.  5 of these enhance after contrast is administered.  The brainstem and cerebellum appear normal.  MRI of the cervical and thoracic spine August 03, 2019: There are nonenhancing foci consistent with chronic demyelination adjacent to C2-C3 (posterior very slightly left greater than right), adjacent to C3 (ventral midline), adjacent to C6 (central), T6 (central) and T11-T12 (posterior).  There were no significant degenerative changes.  MRI brain 02/18/2021 no new lesions compared to  08/02/2019  MRI cervical spine 02/18/2021 showed T2 hyperintense foci within the spinal cord adjacent to C2-C3, C3, and C5-C6.  None of the foci enhance or appear to be acute.  These are consistent with chronic demyelinating plaque associated with multiple sclerosis.      Small disc osteophyte complex to the right at C3-C4.  No nerve root compression.  Degenerative changes are not noted at other cervical levels.  MRI brain 01/23/2023 showed Multiple T2/FLAIR hyperintense foci in the cerebral hemispheres in a pattern consistent with chronic demyelinating plaque associated with multiple sclerosis. None of the foci enhanced or appear to be acute. Compared to the MRI from 02/18/2021, there are no new lesions.   MRI cervical spine  01/23/2023 showed There are T2 hyperintense foci noted within the spinal cord posterocentrally to the left adjacent to C2-C3, centrally adjacent to C3, posteriorly adjacent to C3, posterocentrally adjacent to C5-C6.   Mild DJD at C3C4.     LABS Pertinent lab work 08/02/2019: ESR, CRP, anti-Jo 1 were normal or negative.  The anti-double-stranded DNA antibodies were slightly positive.  Borderline anemia (he hemoglobin 11.8), normal liver function tests, normal lymphocyte count  REVIEW OF SYSTEMS: Constitutional: No fevers, chills, sweats, or change in appetite Eyes: No visual changes, double vision, eye pain Ear, nose and throat: No hearing loss, ear pain, nasal congestion, sore throat Cardiovascular: No chest pain, palpitations Respiratory:  No shortness of breath at rest or with exertion.   No wheezes GastrointestinaI: No nausea, vomiting, diarrhea, abdominal pain, fecal incontinence Genitourinary:  No dysuria, urinary retention or frequency.  No nocturia. Musculoskeletal:  No neck pain, back pain Integumentary: No rash, pruritus, skin lesions Neurological: as above Psychiatric: No depression at this time.  No anxiety Endocrine: No palpitations, diaphoresis, change in appetite,  change in weigh or increased thirst Hematologic/Lymphatic:  No anemia, purpura, petechiae. Allergic/Immunologic: No itchy/runny eyes, nasal congestion, recent allergic reactions, rashes  ALLERGIES: No Known Allergies  HOME MEDICATIONS:  Current Outpatient Medications:    norgestrel-ethinyl estradiol (CRYSELLE-28) 0.3-30 MG-MCG tablet, Take 1 tablet by mouth daily., Disp: , Rfl:    ocrelizumab (OCREVUS) 300 MG/10ML injection, Inject into the vein once., Disp: , Rfl:    oxybutynin (DITROPAN) 5 MG tablet, Take 1 tablet (5 mg total) by mouth daily. For bladder, Disp: 90 tablet, Rfl: 2   tiZANidine (ZANAFLEX) 2 MG tablet, Take one or two po QHS, Disp: 60 tablet, Rfl: 5   cephALEXin (KEFLEX) 500 MG capsule, Take 1 capsule (500 mg total) by mouth 2 (two) times daily. (Patient not taking: Reported on 03/10/2023), Disp: 13 capsule, Rfl: 0   polyethylene glycol (MIRALAX) 17 g packet, Take 17 g by mouth  daily. (Patient not taking: Reported on 03/10/2023), Disp: 14 each, Rfl: 0   venlafaxine XR (EFFEXOR XR) 75 MG 24 hr capsule, Take 2 capsules (150 mg total) by mouth daily., Disp: 180 capsule, Rfl: 3  PAST MEDICAL HISTORY: Past Medical History:  Diagnosis Date   Anxiety    Depression    Multiple sclerosis (HCC)     PAST SURGICAL HISTORY: Past Surgical History:  Procedure Laterality Date   WISDOM TOOTH EXTRACTION     x2    FAMILY HISTORY: No family history on file.  SOCIAL HISTORY:  Social History   Socioeconomic History   Marital status: Single    Spouse name: Not on file   Number of children: 0   Years of education: some college   Highest education level: Not on file  Occupational History   Occupation: [employer]  Tobacco Use   Smoking status: Never   Smokeless tobacco: Never  Vaping Use   Vaping status: Never Used  Substance and Sexual Activity   Alcohol use: Yes    Comment: socially   Drug use: Yes    Types: Marijuana   Sexual activity: Yes    Birth control/protection:  Pill, Condom  Other Topics Concern   Not on file  Social History Narrative   Right handed    Social Determinants of Health   Financial Resource Strain: Not on file  Food Insecurity: Not on file  Transportation Needs: Not on file  Physical Activity: Not on file  Stress: Not on file  Social Connections: Unknown (09/15/2021)   Received from [facility]   Social Network    Social Network: Not on file  Intimate Partner Violence: Unknown (08/22/2021)   Received from [facility], [facility]   HITS    Physically Hurt: Not on file    Insult or Talk Down To: Not on file    Threaten Physical Harm: Not on file    Scream or Curse: Not on file     PHYSICAL EXAM  Vitals:   03/10/23 1501  BP: 109/67  Pulse: 76  Weight: 209 lb (94.8 kg)  Height: 5\' 6"  (1.676 m)    Body mass index is 33.73 kg/m.   General: The patient is well-developed and well-nourished and in no acute distress.  Neck tenderness  HEENT:  Head is [place]/AT.   Skin/musculoskeletal: Extremities are without rash or  edema.   Tender over many FMS tender points of chest/neck  Neurologic Exam  Mental status: The patient is alert and oriented x 3 at the time of the examination. The patient has apparent normal recent and remote memory, with an apparently normal attention span and concentration ability.   Speech is normal.  Cranial nerves: Extraocular movements are full.  Mild reduced color vision OS.   Facial symmetry is present. There is good facial sensation to soft touch bilaterally.Facial strength is normal.  Trapezius and sternocleidomastoid strength is normal. No dysarthria is noted.   No obvious hearing deficits are noted.  Motor:  Muscle bulk is normal.   Tone is normal. Strength is  5 / 5 in all 4 extremities.   Sensory: Sensory testing is intact to touch, temperature and vibration in arms but reduced left leg.    Coordination: Cerebellar testing reveals good finger-nose-finger and heel-to-shin  bilaterally.  Gait and station: Station is normal.   Gait is slightly wide Tandem gait is wide. Romberg is negative.   Reflexes: Deep tendon reflexes are symmetric and 2 in the arms,  3 at knees and ankles      DIAGNOSTIC DATA (LABS, IMAGING, TESTING) - I reviewed patient records, labs, notes, testing and imaging myself where available.  Lab Results  Component Value Date   WBC 8.5 02/16/2023   HGB 12.7 02/16/2023   HCT 38.1 02/16/2023   MCV 87.4 02/16/2023   PLT 210 02/16/2023      Component Value Date/Time   NA 138 02/16/2023 2042   K 3.8 02/16/2023 2042   CL 104 02/16/2023 2042   CO2 24 02/16/2023 2042   GLUCOSE 89 02/16/2023 2042   BUN 10 02/16/2023 2042   CREATININE 0.78 02/16/2023 2042   CALCIUM 9.4 02/16/2023 2042   PROT 7.4 02/16/2023 2042   PROT 6.8 04/29/2020 1433   ALBUMIN 4.3 02/16/2023 2042   ALBUMIN 4.3 04/29/2020 1433   AST 15 02/16/2023 2042   ALT 11 02/16/2023 2042   ALKPHOS 37 (L) 02/16/2023 2042   BILITOT 0.4 02/16/2023 2042   BILITOT <0.2 04/29/2020 1433   GFRNONAA >60 02/16/2023 2042   GFRAA >60 08/03/2019 0353       ASSESSMENT AND PLAN  Relapsing remitting multiple sclerosis (HCC) - Plan: IgG, IgA, IgM, CBC with Differential/Platelet  Gait disorder  Dysesthesia  Urinary urgency  Anxiety and depression  High risk medication use - Plan: IgG, IgA, IgM, CBC with Differential/Platelet  Abnormal laboratory test - Plan: IgG, IgA, IgM, CBC with Differential/Platelet  Vitamin D deficiency - Plan: VITAMIN D 25 Hydroxy (Vit-D Deficiency, Fractures)   1.   Continue Ocrevus.  Check IgG/IgM and CBC/D  2.   Continue Oxybutynin for bladder  3.   Increase Effexor to 150 mg daily for myalgia and mood.   Change trazodone to tizanidine 4.   Adjust FMLA to be off next week (keep intermittent for flare) 6.   rtc 6 months or sooner if new or worsening neuologic function   Verneal Wiers A. Epimenio Foot, MD, [facility] 03/10/2023, 3:31 PM Certified in Neurology,  Clinical Neurophysiology, Sleep Medicine and Neuroimaging  [facility] [address], [address] [place], [place] [address] [phone]

## 2023-03-11 ENCOUNTER — Telehealth: Payer: Self-pay | Admitting: *Deleted

## 2023-03-11 ENCOUNTER — Other Ambulatory Visit: Payer: Self-pay | Admitting: Neurology

## 2023-03-11 LAB — CBC WITH DIFFERENTIAL/PLATELET
Basophils Absolute: 0 10*3/uL (ref 0.0–0.2)
Basos: 1 %
EOS (ABSOLUTE): 0.1 10*3/uL (ref 0.0–0.4)
Eos: 2 %
Hematocrit: 38.5 % (ref 34.0–46.6)
Hemoglobin: 12.3 g/dL (ref 11.1–15.9)
Immature Grans (Abs): 0 10*3/uL (ref 0.0–0.1)
Immature Granulocytes: 0 %
Lymphocytes Absolute: 1.5 10*3/uL (ref 0.7–3.1)
Lymphs: 26 %
MCH: 28.8 pg (ref 26.6–33.0)
MCHC: 31.9 g/dL (ref 31.5–35.7)
MCV: 90 fL (ref 79–97)
Monocytes Absolute: 0.8 10*3/uL (ref 0.1–0.9)
Monocytes: 13 %
Neutrophils Absolute: 3.4 10*3/uL (ref 1.4–7.0)
Neutrophils: 58 %
Platelets: 269 10*3/uL (ref 150–450)
RBC: 4.27 x10E6/uL (ref 3.77–5.28)
RDW: 12.7 % (ref 11.7–15.4)
WBC: 5.8 10*3/uL (ref 3.4–10.8)

## 2023-03-11 LAB — IGG, IGA, IGM
IgA/Immunoglobulin A, Serum: 113 mg/dL (ref 87–352)
IgG (Immunoglobin G), Serum: 1175 mg/dL (ref 586–1602)
IgM (Immunoglobulin M), Srm: 84 mg/dL (ref 26–217)

## 2023-03-11 LAB — VITAMIN D 25 HYDROXY (VIT D DEFICIENCY, FRACTURES): Vit D, 25-Hydroxy: 13.1 ng/mL — ABNORMAL LOW (ref 30.0–100.0)

## 2023-03-11 MED ORDER — VITAMIN D (ERGOCALCIFEROL) 1.25 MG (50000 UNIT) PO CAPS: 50000.0000 [IU] | ORAL_CAPSULE | ORAL | 3 refills | Status: AC

## 2023-03-11 NOTE — Telephone Encounter (Signed)
   Please attempt prior authorization

## 2023-03-11 NOTE — Telephone Encounter (Signed)
Message sent to Prior authorization team for PA.

## 2023-03-12 ENCOUNTER — Other Ambulatory Visit (HOSPITAL_COMMUNITY): Payer: Self-pay

## 2023-03-12 ENCOUNTER — Telehealth: Payer: Self-pay

## 2023-03-12 NOTE — Telephone Encounter (Signed)
Sent mychart to pt.

## 2023-03-12 NOTE — Telephone Encounter (Signed)
I was trying to work on the PA however CMM and the test claim requires the patient to call the members services number located on the back of the card. They might need ot talk to her first before we can submit. We see that happen sometimes.        I ran test claim for 30DS and 90DS.

## 2023-03-16 NOTE — Telephone Encounter (Signed)
Gave addended FMLA to medical records to send in for pt.

## 2023-03-18 DIAGNOSIS — Z0289 Encounter for other administrative examinations: Secondary | ICD-10-CM

## 2023-03-24 ENCOUNTER — Telehealth: Payer: Self-pay | Admitting: *Deleted

## 2023-03-24 NOTE — Telephone Encounter (Signed)
R/c pt short term form on 03/24/2023

## 2023-04-05 NOTE — Telephone Encounter (Signed)
LVM asking for pt to call office to discuss paperwork.

## 2023-04-05 NOTE — Telephone Encounter (Signed)
Called pt at  [phone]. LVM for pt to call.

## 2023-04-06 ENCOUNTER — Telehealth: Payer: Self-pay | Admitting: *Deleted

## 2023-04-06 NOTE — Telephone Encounter (Signed)
Called pt at  [phone]. LVM for pt to call.

## 2023-04-06 NOTE — Telephone Encounter (Signed)
Gave completed/signed from back to medical records to process for pt.

## 2023-04-06 NOTE — Telephone Encounter (Signed)
Took call from phone room and spoke with pt. She would like STD to be from 03/29/23-06/18/23.  1. Her job requires she lift up to 50lb but this is difficult for her. She feels she can lift 35-40lb safely. Cannot life heavy weight repetitively.  2. She reports getting dizzy intermittently while bending over 3. Feels she can stand, sit, drive frequently 4. Feels she can walk, bend occasionally 5. 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 Epimenio Foot review form. Once complete, we will have medical records process/turn in for her. Her case workers name is Scientist, research (medical).

## 2023-04-06 NOTE — Telephone Encounter (Signed)
Pt [employer] form faxed on 04/06/2023 to [phone]

## 2023-04-26 ENCOUNTER — Other Ambulatory Visit: Payer: Self-pay

## 2023-05-04 ENCOUNTER — Telehealth: Payer: Self-pay | Admitting: Neurology

## 2023-05-04 NOTE — Telephone Encounter (Signed)
[name] with [employer] states she is needing clarification on information provided on the Treating Provider Statement form that was faxed over for pt. Requesting call back to [phone]

## 2023-05-05 NOTE — Telephone Encounter (Addendum)
I spoke with [name].  She is needing to clarify patient's permanent restriction of no more than 40 lb lift 1x/hr, and if she is capable of light work now. Also needing to clarify her return to work date. She is asking what will be differnt in January for return to work. They have copy of pt's office note.

## 2023-05-05 NOTE — Telephone Encounter (Signed)
Spoke with Dr Epimenio Foot. Will call pt to see if she feels she is capable of returning to work, then we can clarify the return to work date. At this time he is recommending light work upon return based on pt assessment.

## 2023-05-05 NOTE — Telephone Encounter (Signed)
355 pm I called the patient and LVM asking for call back.

## 2023-05-06 NOTE — Telephone Encounter (Signed)
Pt returning call. Requesting call back.  

## 2023-05-06 NOTE — Telephone Encounter (Signed)
I spoke with the patient.  She recalls that Dr. Epimenio Foot had said he would have her out of work for 1 week but then also said he would put her on short-term disability so she has not worked since November.  She thinks she may be denied the short-term disability and is not sure if they will let her go back. 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

## 2023-05-10 NOTE — Telephone Encounter (Signed)
Spoke with Dr Epimenio Foot. Ok for pt to return to work now, light work duty. Keep permanent restriction of not lifting more than 40 lb once per hour, allow for breaks. I called [name] back and left this on her voicemail. She can call us back. Do we need to change the date on the form or is verbal confirmation ok?

## 2023-05-10 NOTE — Telephone Encounter (Signed)
Placed form in the pod 1 sater box

## 2023-07-28 ENCOUNTER — Telehealth: Payer: Self-pay | Admitting: *Deleted

## 2023-07-28 NOTE — Telephone Encounter (Signed)
 LVM for pt to call office.  We received consent form from [place] that she needs to complete/sign for Ocrevus infusion. She can stop by office or we can email to her to complete and send back.

## 2023-07-29 NOTE — Telephone Encounter (Signed)
 2nd attempt. LVM for pt to call office. Mychart msg sent    We received consent form from [place] that she needs to complete/sign for Ocrevus infusion. She can stop by office or we can email to her to complete and send back.

## 2023-08-05 NOTE — Telephone Encounter (Signed)
 Pt returned call and LVM for RN to call her back once she is available.

## 2023-08-05 NOTE — Telephone Encounter (Signed)
 LVM for pt to call office. Trying to confirm if she received form [place]igh emailed to her and if she turned in.

## 2023-08-05 NOTE — Telephone Encounter (Signed)
 Returned call to pt who stated that she sent the ocrevus form into [place] via mychart, however; I couldn't find it. So I told her that I would send her a mychart msg for her to upload the picture into and she voiced understanding

## 2023-08-05 NOTE — Telephone Encounter (Signed)
 Faxed ocrevus fax form:

## 2023-08-14 ENCOUNTER — Other Ambulatory Visit: Payer: Self-pay | Admitting: Neurology

## 2023-08-16 NOTE — Telephone Encounter (Signed)
 Last seen on 03/10/23 Follow up scheduled 09/28/23

## 2023-09-15 ENCOUNTER — Telehealth: Payer: Self-pay | Admitting: Neurology

## 2023-09-15 NOTE — Telephone Encounter (Signed)
 Appointment details confirmed

## 2023-09-28 ENCOUNTER — Ambulatory Visit: Payer: Managed Care, Other (non HMO) | Admitting: Neurology

## 2023-11-25 ENCOUNTER — Other Ambulatory Visit: Payer: Self-pay | Admitting: Neurology

## 2023-11-25 ENCOUNTER — Other Ambulatory Visit: Payer: Self-pay

## 2023-11-25 NOTE — Telephone Encounter (Signed)
 Last seen on 03/10/23 Follow up scheduled 01/25/24

## 2024-01-25 ENCOUNTER — Ambulatory Visit: Admitting: Neurology

## 2024-01-26 ENCOUNTER — Telehealth: Payer: Self-pay | Admitting: *Deleted

## 2024-01-26 NOTE — Telephone Encounter (Addendum)
 Per [facility], pt cx appt 01/25/24. Appears she called 01/18/24 and cx and r/s for 05/04/24. However, she needs updated visit/labs before next Ocrevus infusion scheduled for 02/14/24.   Her last OV was 03/10/23 which is when her last labs were done.   Silvano tried calling pt today but states: Othell's phone just rings and then says I'm sorry your call can not be completed at this time try again later:   I tried calling pt at [phone]. Rang once and went to VM. LVM asking pt to call office.

## 2024-01-27 NOTE — Telephone Encounter (Signed)
 Per Holly/[facility], she has also tried reaching pt and unable

## 2024-01-27 NOTE — Telephone Encounter (Signed)
 Called pt at [phone]. Rang twice and went to VM. LVM for pt to call

## 2024-01-31 ENCOUNTER — Encounter: Payer: Self-pay | Admitting: *Deleted

## 2024-01-31 NOTE — Telephone Encounter (Signed)
 Sent letter since pt unable to be reached by phone. [facility] made aware.

## 2024-03-07 ENCOUNTER — Other Ambulatory Visit: Payer: Self-pay | Admitting: Neurology

## 2024-03-07 NOTE — Telephone Encounter (Signed)
 Last seen on 03/10/23 Follow up scheduled on 05/04/24

## 2024-05-04 ENCOUNTER — Ambulatory Visit: Admitting: Neurology

## 2024-11-29 ENCOUNTER — Ambulatory Visit: Payer: Self-pay | Admitting: Neurology
